# Patient Record
Sex: Male | Born: 1965 | Race: White | Hispanic: No | Marital: Married
Health system: Southern US, Community
[De-identification: ages and names within clinical notes are randomized; demographics above are authoritative.]

## PROBLEM LIST (undated history)

## (undated) DIAGNOSIS — G9341 Metabolic encephalopathy: Secondary | ICD-10-CM

## (undated) DIAGNOSIS — G40909 Epilepsy, unspecified, not intractable, without status epilepticus: Secondary | ICD-10-CM

## (undated) DIAGNOSIS — J9621 Acute and chronic respiratory failure with hypoxia: Secondary | ICD-10-CM

## (undated) DIAGNOSIS — F3181 Bipolar II disorder: Secondary | ICD-10-CM

## (undated) DIAGNOSIS — A419 Sepsis, unspecified organism: Secondary | ICD-10-CM

## (undated) DIAGNOSIS — Z79899 Other long term (current) drug therapy: Secondary | ICD-10-CM

## (undated) DIAGNOSIS — R6520 Severe sepsis without septic shock: Secondary | ICD-10-CM

---

## 2018-12-24 ENCOUNTER — Other Ambulatory Visit (HOSPITAL_COMMUNITY): Payer: Self-pay

## 2018-12-24 ENCOUNTER — Inpatient Hospital Stay
Admit: 2018-12-24 | Discharge: 2019-01-21 | Disposition: A | Discharge status: Home / Self Care | Payer: Medicare Other | Source: Other Acute Inpatient Hospital | Attending: Internal Medicine | Admitting: Internal Medicine

## 2018-12-24 DIAGNOSIS — Z9911 Dependence on respirator [ventilator] status: Secondary | ICD-10-CM

## 2018-12-24 DIAGNOSIS — G9341 Metabolic encephalopathy: Secondary | ICD-10-CM | POA: Diagnosis present

## 2018-12-24 DIAGNOSIS — J9621 Acute and chronic respiratory failure with hypoxia: Secondary | ICD-10-CM | POA: Diagnosis present

## 2018-12-24 DIAGNOSIS — Z4659 Encounter for fitting and adjustment of other gastrointestinal appliance and device: Secondary | ICD-10-CM

## 2018-12-24 DIAGNOSIS — J111 Influenza due to unidentified influenza virus with other respiratory manifestations: Secondary | ICD-10-CM

## 2018-12-24 DIAGNOSIS — F3181 Bipolar II disorder: Secondary | ICD-10-CM | POA: Diagnosis present

## 2018-12-24 DIAGNOSIS — Z79899 Other long term (current) drug therapy: Secondary | ICD-10-CM

## 2018-12-24 DIAGNOSIS — Z9289 Personal history of other medical treatment: Secondary | ICD-10-CM

## 2018-12-24 DIAGNOSIS — G40909 Epilepsy, unspecified, not intractable, without status epilepticus: Secondary | ICD-10-CM

## 2018-12-24 DIAGNOSIS — Z0189 Encounter for other specified special examinations: Secondary | ICD-10-CM

## 2018-12-24 DIAGNOSIS — J9690 Respiratory failure, unspecified, unspecified whether with hypoxia or hypercapnia: Secondary | ICD-10-CM

## 2018-12-24 DIAGNOSIS — J90 Pleural effusion, not elsewhere classified: Secondary | ICD-10-CM

## 2018-12-24 DIAGNOSIS — Z95828 Presence of other vascular implants and grafts: Secondary | ICD-10-CM

## 2018-12-24 DIAGNOSIS — A419 Sepsis, unspecified organism: Secondary | ICD-10-CM | POA: Diagnosis present

## 2018-12-24 DIAGNOSIS — R112 Nausea with vomiting, unspecified: Secondary | ICD-10-CM

## 2018-12-24 DIAGNOSIS — T85598A Other mechanical complication of other gastrointestinal prosthetic devices, implants and grafts, initial encounter: Secondary | ICD-10-CM

## 2018-12-24 DIAGNOSIS — J189 Pneumonia, unspecified organism: Secondary | ICD-10-CM

## 2018-12-24 DIAGNOSIS — R509 Fever, unspecified: Secondary | ICD-10-CM

## 2018-12-24 HISTORY — DX: Sepsis, unspecified organism: R65.20

## 2018-12-24 HISTORY — DX: Metabolic encephalopathy: G93.41

## 2018-12-24 HISTORY — DX: Bipolar II disorder: F31.81

## 2018-12-24 HISTORY — DX: Epilepsy, unspecified, not intractable, without status epilepticus: G40.909

## 2018-12-24 HISTORY — DX: Sepsis, unspecified organism: A41.9

## 2018-12-24 HISTORY — DX: Acute and chronic respiratory failure with hypoxia: J96.21

## 2018-12-24 HISTORY — DX: Other long term (current) drug therapy: Z79.899

## 2018-12-25 ENCOUNTER — Other Ambulatory Visit (HOSPITAL_COMMUNITY): Payer: Self-pay

## 2018-12-25 ENCOUNTER — Encounter: Payer: Self-pay | Admitting: Internal Medicine

## 2018-12-25 DIAGNOSIS — J9621 Acute and chronic respiratory failure with hypoxia: Secondary | ICD-10-CM | POA: Diagnosis present

## 2018-12-25 DIAGNOSIS — R6520 Severe sepsis without septic shock: Secondary | ICD-10-CM | POA: Diagnosis present

## 2018-12-25 DIAGNOSIS — G40909 Epilepsy, unspecified, not intractable, without status epilepticus: Secondary | ICD-10-CM

## 2018-12-25 DIAGNOSIS — F3181 Bipolar II disorder: Secondary | ICD-10-CM | POA: Diagnosis not present

## 2018-12-25 DIAGNOSIS — Z79899 Other long term (current) drug therapy: Secondary | ICD-10-CM

## 2018-12-25 DIAGNOSIS — A419 Sepsis, unspecified organism: Secondary | ICD-10-CM | POA: Diagnosis present

## 2018-12-25 DIAGNOSIS — G9341 Metabolic encephalopathy: Secondary | ICD-10-CM | POA: Diagnosis present

## 2018-12-25 LAB — COMPREHENSIVE METABOLIC PANEL
ALT: 15 U/L (ref 0–44)
AST: 19 U/L (ref 15–41)
Albumin: 2.9 g/dL — ABNORMAL LOW (ref 3.5–5.0)
Alkaline Phosphatase: 70 U/L (ref 38–126)
Anion gap: 9 (ref 5–15)
BUN: 7 mg/dL (ref 6–20)
CO2: 24 mmol/L (ref 22–32)
Calcium: 9.2 mg/dL (ref 8.9–10.3)
Chloride: 107 mmol/L (ref 98–111)
Creatinine, Ser: 0.71 mg/dL (ref 0.61–1.24)
GFR calc Af Amer: >60 mL/min (ref >60)
GFR calc non Af Amer: >60 mL/min (ref >60)
Glucose, Bld: 184 mg/dL — ABNORMAL HIGH (ref 70–99)
Potassium: 4.4 mmol/L (ref 3.5–5.1)
Sodium: 140 mmol/L (ref 135–145)
Total Bilirubin: 0.8 mg/dL (ref 0.3–1.2)
Total Protein: 8 g/dL (ref 6.5–8.1)

## 2018-12-25 LAB — CBC
HCT: 40.4 % (ref 39.0–52.0)
Hemoglobin: 13.3 g/dL (ref 13.0–17.0)
MCH: 29.6 pg (ref 26.0–34.0)
MCHC: 32.9 g/dL (ref 30.0–36.0)
MCV: 90 fL (ref 80.0–100.0)
Platelets: 366 10*3/uL (ref 150–400)
RBC: 4.49 MIL/uL (ref 4.22–5.81)
RDW: 13.3 % (ref 11.5–15.5)
WBC: 10.3 10*3/uL (ref 4.0–10.5)
nRBC: 0 % (ref 0.0–0.2)

## 2018-12-25 LAB — URINALYSIS, ROUTINE W REFLEX MICROSCOPIC
Bilirubin Urine: NEGATIVE
Glucose, UA: NEGATIVE mg/dL
Ketones, ur: 5 mg/dL — AB
Nitrite: NEGATIVE
Protein, ur: 30 mg/dL — AB
RBC / HPF: >50 RBC/hpf — ABNORMAL HIGH (ref 0–5)
Specific Gravity, Urine: 1.016 (ref 1.005–1.030)
pH: 5 (ref 5.0–8.0)

## 2018-12-25 LAB — BLOOD GAS, ARTERIAL
Acid-base deficit: 0.7 mmol/L (ref 0.0–2.0)
Bicarbonate: 23.7 mmol/L (ref 20.0–28.0)
FIO2: 40
MECHVT: 500 mL
O2 Saturation: 99.3 %
PEEP: 5 cmH2O
Patient temperature: 98.4
RATE: 12 resp/min
pCO2 arterial: 40.5 mmHg (ref 32.0–48.0)
pH, Arterial: 7.384 (ref 7.350–7.450)
pO2, Arterial: 177 mmHg — ABNORMAL HIGH (ref 83.0–108.0)

## 2018-12-25 LAB — APTT
aPTT: 20 seconds — ABNORMAL LOW (ref 24–36)
aPTT: 33 seconds (ref 24–36)

## 2018-12-25 LAB — PROTIME-INR
INR: 1.1 (ref 0.8–1.2)
INR: 1.1 (ref 0.8–1.2)
Prothrombin Time: 14.5 seconds (ref 11.4–15.2)
Prothrombin Time: 13.7 seconds (ref 11.4–15.2)

## 2018-12-25 LAB — MAGNESIUM: Magnesium: 1.8 mg/dL (ref 1.7–2.4)

## 2018-12-25 LAB — AMMONIA: Ammonia: 47 umol/L — ABNORMAL HIGH (ref 9–35)

## 2018-12-25 MED ORDER — DEXTROSE 10 % IV SOLN
250.00 | INTRAVENOUS | Status: DC
Start: ? — End: 2018-12-25

## 2018-12-25 MED ORDER — IPRATROPIUM-ALBUTEROL 0.5-2.5 (3) MG/3ML IN SOLN
3.00 | RESPIRATORY_TRACT | Status: DC
Start: ? — End: 2018-12-25

## 2018-12-25 MED ORDER — ONDANSETRON HCL 4 MG/2ML IJ SOLN
4.00 | INTRAMUSCULAR | Status: DC
Start: ? — End: 2018-12-25

## 2018-12-25 MED ORDER — LACTATED RINGERS IV SOLN
500.00 | INTRAVENOUS | Status: DC
Start: ? — End: 2018-12-25

## 2018-12-25 MED ORDER — HEPARIN SODIUM (PORCINE) 5000 UNIT/ML IJ SOLN
5000.00 | INTRAMUSCULAR | Status: DC
Start: 2018-12-24 — End: 2018-12-25

## 2018-12-25 MED ORDER — EPINEPHRINE 1 MG/10ML IJ SOSY
1.00 | PREFILLED_SYRINGE | INTRAMUSCULAR | Status: DC
Start: ? — End: 2018-12-25

## 2018-12-25 MED ORDER — ALPRAZOLAM 1 MG PO TABS
1.00 | ORAL_TABLET | ORAL | Status: DC
Start: 2018-12-25 — End: 2018-12-25

## 2018-12-25 MED ORDER — PROSOURCE TF PO LIQD
90.00 | ORAL | Status: DC
Start: ? — End: 2018-12-25

## 2018-12-25 MED ORDER — INSULIN ASPART 100 UNIT/ML FLEXPEN
0.00 | PEN_INJECTOR | SUBCUTANEOUS | Status: DC
Start: 2018-12-24 — End: 2018-12-25

## 2018-12-25 MED ORDER — INSULIN NPH (HUMAN) (ISOPHANE) 100 UNIT/ML ~~LOC~~ SUSP
15.00 | SUBCUTANEOUS | Status: DC
Start: 2018-12-24 — End: 2018-12-25

## 2018-12-25 MED ORDER — GENERIC EXTERNAL MEDICATION
4000.00 | Status: DC
Start: ? — End: 2018-12-25

## 2018-12-25 MED ORDER — ATORVASTATIN CALCIUM 20 MG PO TABS
20.00 | ORAL_TABLET | ORAL | Status: DC
Start: 2018-12-25 — End: 2018-12-25

## 2018-12-25 MED ORDER — FAMOTIDINE 20 MG PO TABS
20.00 | ORAL_TABLET | ORAL | Status: DC
Start: 2018-12-25 — End: 2018-12-25

## 2018-12-25 MED ORDER — LORAZEPAM 2 MG/ML IJ SOLN
2.00 | INTRAMUSCULAR | Status: DC
Start: ? — End: 2018-12-25

## 2018-12-25 MED ORDER — ATROPINE SULFATE 0.5 MG/5ML IJ SOSY
0.50 | PREFILLED_SYRINGE | INTRAMUSCULAR | Status: DC
Start: ? — End: 2018-12-25

## 2018-12-25 MED ORDER — CLINDAMYCIN HCL 150 MG PO CAPS
300.00 | ORAL_CAPSULE | ORAL | Status: DC
Start: 2018-12-24 — End: 2018-12-25

## 2018-12-25 MED ORDER — GENERIC EXTERNAL MEDICATION
Status: DC
Start: ? — End: 2018-12-25

## 2018-12-25 MED ORDER — GENERIC EXTERNAL MEDICATION
12.00 | Status: DC
Start: ? — End: 2018-12-25

## 2018-12-25 MED ORDER — ACETAMINOPHEN 325 MG PO TABS
650.00 | ORAL_TABLET | ORAL | Status: DC
Start: ? — End: 2018-12-25

## 2018-12-25 MED ORDER — LEVETIRACETAM 250 MG PO TABS
1000.00 | ORAL_TABLET | ORAL | Status: DC
Start: 2018-12-24 — End: 2018-12-25

## 2018-12-25 MED ORDER — GENERIC EXTERNAL MEDICATION
1.00 | Status: DC
Start: ? — End: 2018-12-25

## 2018-12-25 MED ORDER — ZOLPIDEM TARTRATE 5 MG PO TABS
5.00 | ORAL_TABLET | ORAL | Status: DC
Start: ? — End: 2018-12-25

## 2018-12-25 MED ORDER — FENTANYL CITRATE-NACL 1-0.9 MG/100ML-% IV SOLN
50.00 | INTRAVENOUS | Status: DC
Start: ? — End: 2018-12-25

## 2018-12-25 MED ORDER — VENLAFAXINE HCL 37.5 MG PO TABS
75.00 | ORAL_TABLET | ORAL | Status: DC
Start: 2018-12-25 — End: 2018-12-25

## 2018-12-25 MED ORDER — GENERIC EXTERNAL MEDICATION
5.00 | Status: DC
Start: ? — End: 2018-12-25

## 2018-12-25 MED ORDER — PROPOFOL 100 MG/10ML IV EMUL
20.00 | INTRAVENOUS | Status: DC
Start: ? — End: 2018-12-25

## 2018-12-25 MED ORDER — CHLORHEXIDINE GLUCONATE 0.12 % MT SOLN
15.00 | OROMUCOSAL | Status: DC
Start: 2018-12-24 — End: 2018-12-25

## 2018-12-25 MED ORDER — SODIUM CHLORIDE 0.9 % IV SOLN
INTRAVENOUS | Status: DC
Start: ? — End: 2018-12-25

## 2018-12-25 MED ORDER — GENERIC EXTERNAL MEDICATION
125.00 | Status: DC
Start: 2018-12-25 — End: 2018-12-25

## 2018-12-25 MED ORDER — GABAPENTIN 300 MG PO CAPS
300.00 | ORAL_CAPSULE | ORAL | Status: DC
Start: 2018-12-24 — End: 2018-12-25

## 2018-12-25 MED ORDER — DEXTROSE 10 % IV SOLN
INTRAVENOUS | Status: DC
Start: ? — End: 2018-12-25

## 2018-12-25 MED ORDER — NALOXONE HCL 0.4 MG/ML IJ SOLN
0.04 | INTRAMUSCULAR | Status: DC
Start: ? — End: 2018-12-25

## 2018-12-25 MED ORDER — VECURONIUM BROMIDE 10 MG IV SOLR
10.00 | INTRAVENOUS | Status: DC
Start: ? — End: 2018-12-25

## 2018-12-25 MED ORDER — VALPROIC ACID 250 MG/5ML PO SOLN
500.00 | ORAL | Status: DC
Start: 2018-12-24 — End: 2018-12-25

## 2018-12-25 MED ORDER — MAGNESIUM SULFATE 4 GM/100ML IV SOLN
4.00 | INTRAVENOUS | Status: DC
Start: ? — End: 2018-12-25

## 2018-12-25 MED ORDER — CARBOXYMETHYLCELLULOSE SOD PF 0.5 % OP SOLN
1.00 | OPHTHALMIC | Status: DC
Start: 2018-12-24 — End: 2018-12-25

## 2018-12-25 NOTE — Consult Note (Signed)
Pulmonary [place]  Date of Service: 12/25/2018  PULMONARY CRITICAL CARE TOR TSUDA Flemmings  ZOX:[id number]  DOB: 01-12-1966   DOA: 12/24/2018  Referring Physician: Merton Border, MD  HPI: Martin Hays is a 53 y.o. male seen for follow up of Acute on Chronic Respiratory Failure.  Patient has multiple medical problems was presented to the hospital with altered mental status patient has a history of bipolar disorder multiple medications being required for that.  The patient presented with respiratory failure and sepsis.  Psychiatry apparently did see the patient for medication issues.  When the patient initially presented in the emergency department he was noted to be extremely delirious combative patient ended up having to be intubated.  Patient also was found to have a red hot toe along with fevers.  It was felt because of this that the patient was septic.  Since admission to the ICU they have not been able to extubate the patient he was extubated on June 13 and had to be reintubated approximately 8 days later.  Right now he is orally intubated on the ventilator.  Hospital course patient was seen by podiatry for the issue with the toe and dressings were changed antibiotics were adjusted.  Patient was also seen by psychiatry and the polypharmacy was addressed at that time.  Patient right now is on full support on assist control mode and still has been requiring sedation with fentanyl and propofol.  Review of Systems:  ROS performed and is unremarkable other than noted above.  Past Medical History:  Diagnosis Date  . Anxiety  . Back pain  . Bipolar 1 disorder ([place])  . Bipolar disorder, unspecified ([place])  . Diabetes mellitus ([place])  . DM (diabetes mellitus) ([place])  . GERD (gastroesophageal reflux disease)  . H/O cardiovascular stress test  01/2014: No ischemia, EF 64%. Negative stress 03/2009  . Headache(784.0)  .  Hepatitis C  . History of echocardiogram 2015  EF 50-55%  . History of left heart catheterization  07/2007: Normal coronary angiography . RCA congentially diminutive small vessel.  Marland Kitchen History of syncope  . Hyperlipidemia  . Hypothyroidism  . Retinal perivasculitis  . Seizure disorder ([place])  . Status post placement of implantable loop recorder  . Syncope   Past Surgical History:  Procedure Laterality Date  . HX HEART CATHETERIZATION 09/2014  No critical disease RCA is nondominant  . HX HIP SURGERY  . HX SHOULDER SURGERY  . HX SPINAL SURGERY  . HX SURGICAL OTHER  implanted heart monitor  . HX TONSILLECTOMY  . LOOP RECORDER ANALYSIS IN PERSON   Family History  Problem Relation Name Age of Onset  . Heart Disease Father  . Heart Disease Mother  . Cancer-Breast Sister  . Cancer-Brain Maternal Grandfather   Social History   Socioeconomic History  . Marital status: Married  Spouse name: Not on file  . Number of children: Not on file  . Years of education: Not on file  . Highest education level: Not on file  Occupational History  . Not on file  Social Needs  . Financial resource strain: Not on file  . Food insecurity  Worry: Not on file  Inability: Not on file  . Transportation needs  Medical: Not on file  Non-medical: Not on file  Tobacco Use  . Smoking status: Never Smoker  . Smokeless tobacco: Current User  Types: Snuff  . Tobacco comment: started at age 18 dipping  Substance  and Sexual Activity  . Alcohol use: No   Family History: Non-Contributory to the present illness  Not on File  Medications: Reviewed on Rounds  Physical Exam:  Vitals: Temperature 97.0 pulse 96 respiratory rate was 28 blood pressure 149/83 saturations 94%  Ventilator Settings AC 28% Vt 533 Peep 5  . General: Comfortable at this time . Eyes: Grossly normal lids, irises & conjunctiva . ENT: grossly tongue is normal . Neck: no obvious mass . Cardiovascular: S1-S2 normal no gallop  or rub . Respiratory: No rhonchi no rales are noted . Abdomen: Soft and nontender . Skin: no rash seen on limited exam . Musculoskeletal: not rigid . Psychiatric:unable to assess . Neurologic: no seizure no involuntary movements         Labs on Admission:  Basic Metabolic Panel: Recent Labs  Lab 12/25/18 0930  NA 140  K 4.4  CL 107  CO2 24  GLUCOSE 184*  BUN 7  CREATININE 0.71  CALCIUM 9.2  MG 1.8    Recent Labs  Lab 12/25/18 0345  PHART 7.384  PCO2ART 40.5  PO2ART 177*  HCO3 23.7  O2SAT 99.3    Liver Function Tests: Recent Labs  Lab 12/25/18 0930  AST 19  ALT 15  ALKPHOS 70  BILITOT 0.8  PROT 8.0  ALBUMIN 2.9*   No results for input(s): LIPASE, AMYLASE in the last 168 hours. Recent Labs  Lab 12/25/18 0735  AMMONIA 47*    CBC: Recent Labs  Lab 12/25/18 0923  WBC 10.3  HGB 13.3  HCT 40.4  MCV 90.0  PLT 366    Cardiac Enzymes: No results for input(s): CKTOTAL, CKMB, CKMBINDEX, TROPONINI in the last 168 hours.  BNP (last 3 results) No results for input(s): BNP in the last 8760 hours.  ProBNP (last 3 results) No results for input(s): PROBNP in the last 8760 hours.   Radiological Exams on Admission: Dg Chest Port 1 View  Result Date: 12/25/2018 CLINICAL DATA:  Endotracheal tube placement. EXAM: PORTABLE CHEST 1 VIEW COMPARISON:  December 24, 2018 FINDINGS: The endotracheal tube terminates approximately 5.5 cm above the carina. The enteric tube extends below the left hemidiaphragm. The left-sided PICC line is well positioned. There is no pneumothorax. The left basilar airspace opacity is again noted. There is no large pleural effusion. There may be a small left-sided pleural effusion. IMPRESSION: 1. Lines and tubes as above. 2. Otherwise, stable appearance of the chest. Electronically Signed   By: Katherine Mantlehristopher  Green M.D.   On: 12/25/2018 01:48   Dg Chest Port 1 View  Result Date: 12/25/2018 CLINICAL DATA:  Endotracheal tube readjustment. EXAM:  PORTABLE CHEST 1 VIEW COMPARISON:  08/25/2018 FINDINGS: The endotracheal tube terminates approximately 6.4 cm above the carina. The enteric tube terminates below the left hemidiaphragm. The lungs are somewhat hyperexpanded. The patient's left-sided PICC line is stable in positioning. There is no pneumothorax. The heart size is stable. There is a possible left-sided pleural effusion. There may be atelectasis at the left lung base. IMPRESSION: Lines and tubes as above, otherwise stable appearance of the chest. Electronically Signed   By: Katherine Mantlehristopher  Green M.D.   On: 12/25/2018 00:18   Dg Chest Port 1 View  Result Date: 12/24/2018 CLINICAL DATA:  Endotracheal and nasogastric tube placement, respiratory failure EXAM: PORTABLE CHEST 1 VIEW COMPARISON:  None. FINDINGS: Endotracheal tube is positioned with tip over the mid trachea. Esophagogastric tube with tip and side port below the diaphragm, tip positioned in the vicinity of the pylorus  or duodenal bulb. Left upper extremity PICC, tip positioned over the mid SVC. No acute abnormality of the lungs. The heart and mediastinum are unremarkable. Nonobstructive pattern of bowel gas with gas present to the rectum. No free air in the abdomen. IMPRESSION: 1. Endotracheal tube is positioned with tip over the mid trachea. Esophagogastric tube with tip and side port below the diaphragm, tip positioned in the vicinity of the pylorus or duodenal bulb. Left upper extremity PICC, tip positioned over the mid SVC. 2. No acute abnormality of the lungs. The heart and mediastinum are unremarkable. 3. Nonobstructive pattern of bowel gas with gas present to the rectum. No free air in the abdomen. Electronically Signed   By: Lauralyn PrimesAlex  Bibbey M.D.   On: 12/24/2018 21:30   Dg Abd Portable 1v  Result Date: 12/24/2018 CLINICAL DATA:  Endotracheal and nasogastric tube placement, respiratory failure EXAM: PORTABLE CHEST 1 VIEW COMPARISON:  None. FINDINGS: Endotracheal tube is positioned with  tip over the mid trachea. Esophagogastric tube with tip and side port below the diaphragm, tip positioned in the vicinity of the pylorus or duodenal bulb. Left upper extremity PICC, tip positioned over the mid SVC. No acute abnormality of the lungs. The heart and mediastinum are unremarkable. Nonobstructive pattern of bowel gas with gas present to the rectum. No free air in the abdomen. IMPRESSION: 1. Endotracheal tube is positioned with tip over the mid trachea. Esophagogastric tube with tip and side port below the diaphragm, tip positioned in the vicinity of the pylorus or duodenal bulb. Left upper extremity PICC, tip positioned over the mid SVC. 2. No acute abnormality of the lungs. The heart and mediastinum are unremarkable. 3. Nonobstructive pattern of bowel gas with gas present to the rectum. No free air in the abdomen. Electronically Signed   By: Lauralyn PrimesAlex  Bibbey M.D.   On: 12/24/2018 21:30    Assessment/Plan Active Problems:   Acute on chronic respiratory failure with hypoxia (HCC)   Severe sepsis (HCC)   Acute metabolic encephalopathy   Seizure disorder (HCC)   Polypharmacy   Bipolar 2 disorder (HCC)   1. Acute on chronic respiratory failure with hypoxia patient has appears has primarily failed to wean because of delirium issues.  Even now the patient is orally intubated on assist control mode and has periods of increased agitation.  Medications are going to need to be adjusted in addition he has a history of chronic pain med use and this will need to be adjusted. 2. Severe sepsis probable sore diabetic ulcer of the right great toe patient has been treated with antibiotics now is afebrile we will continue to monitor follow-up cultures as necessary. 3. Seizure disorder he has no active seizures noted at this time we will continue to monitor. 4. Metabolic encephalopathy as a result of sepsis patient still quite confused we will continue with supportive care. 5. Polypharmacy is going to need  psychiatry and pain management evaluation. 6. Bipolar disorder as above will need psychiatry evaluation  I have personally seen and evaluated the patient, evaluated laboratory and imaging results, formulated the assessment and plan and placed orders. The Patient requires high complexity decision making for assessment and support.  Case was discussed on Rounds with the Respiratory Therapy Staff Time Spent 70minutes  Yevonne PaxSaadat A Tan Clopper, MD [facility] Pulmonary Critical Care Medicine Sleep Medicine

## 2018-12-26 ENCOUNTER — Other Ambulatory Visit (HOSPITAL_COMMUNITY): Payer: Self-pay

## 2018-12-26 DIAGNOSIS — J9621 Acute and chronic respiratory failure with hypoxia: Secondary | ICD-10-CM | POA: Diagnosis not present

## 2018-12-26 DIAGNOSIS — G9341 Metabolic encephalopathy: Secondary | ICD-10-CM | POA: Diagnosis not present

## 2018-12-26 DIAGNOSIS — Z79899 Other long term (current) drug therapy: Secondary | ICD-10-CM | POA: Diagnosis not present

## 2018-12-26 DIAGNOSIS — F3181 Bipolar II disorder: Secondary | ICD-10-CM | POA: Diagnosis not present

## 2018-12-26 LAB — BLOOD GAS, ARTERIAL
Acid-Base Excess: 3.1 mmol/L — ABNORMAL HIGH (ref 0.0–2.0)
Bicarbonate: 27.2 mmol/L (ref 20.0–28.0)
FIO2: 0.28
MECHVT: 500 mL
O2 Saturation: 96.2 %
PEEP: 5 cmH2O
Patient temperature: 98.6
RATE: 12 resp/min
pCO2 arterial: 42.5 mmHg (ref 32.0–48.0)
pH, Arterial: 7.422 (ref 7.350–7.450)
pO2, Arterial: 83.9 mmHg (ref 83.0–108.0)

## 2018-12-26 LAB — BASIC METABOLIC PANEL
Anion gap: 13 (ref 5–15)
BUN: 5 mg/dL — ABNORMAL LOW (ref 6–20)
CO2: 24 mmol/L (ref 22–32)
Calcium: 9.3 mg/dL (ref 8.9–10.3)
Chloride: 100 mmol/L (ref 98–111)
Creatinine, Ser: 0.69 mg/dL (ref 0.61–1.24)
GFR calc Af Amer: >60 mL/min (ref >60)
GFR calc non Af Amer: >60 mL/min (ref >60)
Glucose, Bld: 181 mg/dL — ABNORMAL HIGH (ref 70–99)
Potassium: 4.7 mmol/L (ref 3.5–5.1)
Sodium: 137 mmol/L (ref 135–145)

## 2018-12-26 LAB — HEMOGLOBIN A1C
Hgb A1c MFr Bld: 7.9 % — ABNORMAL HIGH (ref 4.8–5.6)
Mean Plasma Glucose: 180.03 mg/dL

## 2018-12-26 LAB — CBC
HCT: 41.9 % (ref 39.0–52.0)
Hemoglobin: 13.9 g/dL (ref 13.0–17.0)
MCH: 29.6 pg (ref 26.0–34.0)
MCHC: 33.2 g/dL (ref 30.0–36.0)
MCV: 89.1 fL (ref 80.0–100.0)
Platelets: 428 10*3/uL — ABNORMAL HIGH (ref 150–400)
RBC: 4.7 MIL/uL (ref 4.22–5.81)
RDW: 13.1 % (ref 11.5–15.5)
WBC: 13.8 10*3/uL — ABNORMAL HIGH (ref 4.0–10.5)
nRBC: 0 % (ref 0.0–0.2)

## 2018-12-26 LAB — PHOSPHORUS: Phosphorus: 3 mg/dL (ref 2.5–4.6)

## 2018-12-26 LAB — TSH: TSH: 15.474 u[IU]/mL — ABNORMAL HIGH (ref 0.350–4.500)

## 2018-12-26 LAB — T4, FREE: Free T4: 0.89 ng/dL (ref 0.61–1.12)

## 2018-12-26 LAB — URINE CULTURE: Culture: NO GROWTH

## 2018-12-26 LAB — MAGNESIUM: Magnesium: 1.7 mg/dL (ref 1.7–2.4)

## 2018-12-26 LAB — TRIGLYCERIDES: Triglycerides: 606 mg/dL — ABNORMAL HIGH (ref <150)

## 2018-12-26 MED ORDER — GENERIC EXTERNAL MEDICATION
Status: DC
Start: ? — End: 2018-12-26

## 2018-12-26 NOTE — Progress Notes (Signed)
Pulmonary Critical Care Medicine [place]   PULMONARY CRITICAL CARE SERVICE  PROGRESS NOTE  Date of Service: 12/26/2018  Martin Hays  [id number]  DOB: 1966/06/23   DOA: 12/24/2018  Referring Physician: Merton Border, MD  HPI: Martin Hays is a 53 y.o. male seen for follow up of Acute on Chronic Respiratory Failure.  Patient right now is on full vent support has been quite agitated medications are still being adjusted by the primary team  Medications: Reviewed on Rounds  Physical Exam:  Vitals: Temperature 99.7 pulse 105 respiratory 19 blood pressure 150/65 saturations 95%  Ventilator Settings mode ventilation assist control FiO2 28% tidal volume 500 PEEP 5  . General: Comfortable at this time . Eyes: Grossly normal lids, irises & conjunctiva . ENT: grossly tongue is normal . Neck: no obvious mass . Cardiovascular: S1 S2 normal no gallop . Respiratory: No rhonchi no rales are noted at this time . Abdomen: soft . Skin: no rash seen on limited exam . Musculoskeletal: not rigid . Psychiatric:unable to assess . Neurologic: no seizure no involuntary movements         Lab Data:   Basic Metabolic Panel: Recent Labs  Lab 12/25/18 0930  NA 140  K 4.4  CL 107  CO2 24  GLUCOSE 184*  BUN 7  CREATININE 0.71  CALCIUM 9.2  MG 1.8    ABG: Recent Labs  Lab 12/25/18 0345 12/26/18 0530  PHART 7.384 7.422  PCO2ART 40.5 42.5  PO2ART 177* 83.9  HCO3 23.7 27.2  O2SAT 99.3 96.2    Liver Function Tests: Recent Labs  Lab 12/25/18 0930  AST 19  ALT 15  ALKPHOS 70  BILITOT 0.8  PROT 8.0  ALBUMIN 2.9*   No results for input(s): LIPASE, AMYLASE in the last 168 hours. Recent Labs  Lab 12/25/18 0735  AMMONIA 47*    CBC: Recent Labs  Lab 12/25/18 0923  WBC 10.3  HGB 13.3  HCT 40.4  MCV 90.0  PLT 366    Cardiac Enzymes: No results for input(s): CKTOTAL, CKMB, CKMBINDEX, TROPONINI in the last 168 hours.  BNP (last 3  results) No results for input(s): BNP in the last 8760 hours.  ProBNP (last 3 results) No results for input(s): PROBNP in the last 8760 hours.  Radiological Exams: Dg Chest Port 1 View  Result Date: 12/25/2018 CLINICAL DATA:  Endotracheal tube placement. EXAM: PORTABLE CHEST 1 VIEW COMPARISON:  December 24, 2018 FINDINGS: The endotracheal tube terminates approximately 5.5 cm above the carina. The enteric tube extends below the left hemidiaphragm. The left-sided PICC line is well positioned. There is no pneumothorax. The left basilar airspace opacity is again noted. There is no large pleural effusion. There may be a small left-sided pleural effusion. IMPRESSION: 1. Lines and tubes as above. 2. Otherwise, stable appearance of the chest. Electronically Signed   By: Constance Holster M.D.   On: 12/25/2018 01:48   Dg Chest Port 1 View  Result Date: 12/25/2018 CLINICAL DATA:  Endotracheal tube readjustment. EXAM: PORTABLE CHEST 1 VIEW COMPARISON:  08/25/2018 FINDINGS: The endotracheal tube terminates approximately 6.4 cm above the carina. The enteric tube terminates below the left hemidiaphragm. The lungs are somewhat hyperexpanded. The patient's left-sided PICC line is stable in positioning. There is no pneumothorax. The heart size is stable. There is a possible left-sided pleural effusion. There may be atelectasis at the left lung base. IMPRESSION: Lines and tubes as above, otherwise stable appearance of the chest. Electronically Signed   By:  Katherine Mantlehristopher  Green M.D.   On: 12/25/2018 00:18   Dg Chest Port 1 View  Result Date: 12/24/2018 CLINICAL DATA:  Endotracheal and nasogastric tube placement, respiratory failure EXAM: PORTABLE CHEST 1 VIEW COMPARISON:  None. FINDINGS: Endotracheal tube is positioned with tip over the mid trachea. Esophagogastric tube with tip and side port below the diaphragm, tip positioned in the vicinity of the pylorus or duodenal bulb. Left upper extremity PICC, tip positioned over  the mid SVC. No acute abnormality of the lungs. The heart and mediastinum are unremarkable. Nonobstructive pattern of bowel gas with gas present to the rectum. No free air in the abdomen. IMPRESSION: 1. Endotracheal tube is positioned with tip over the mid trachea. Esophagogastric tube with tip and side port below the diaphragm, tip positioned in the vicinity of the pylorus or duodenal bulb. Left upper extremity PICC, tip positioned over the mid SVC. 2. No acute abnormality of the lungs. The heart and mediastinum are unremarkable. 3. Nonobstructive pattern of bowel gas with gas present to the rectum. No free air in the abdomen. Electronically Signed   By: Lauralyn PrimesAlex  Bibbey M.D.   On: 12/24/2018 21:30   Dg Abd Portable 1v  Result Date: 12/24/2018 CLINICAL DATA:  Endotracheal and nasogastric tube placement, respiratory failure EXAM: PORTABLE CHEST 1 VIEW COMPARISON:  None. FINDINGS: Endotracheal tube is positioned with tip over the mid trachea. Esophagogastric tube with tip and side port below the diaphragm, tip positioned in the vicinity of the pylorus or duodenal bulb. Left upper extremity PICC, tip positioned over the mid SVC. No acute abnormality of the lungs. The heart and mediastinum are unremarkable. Nonobstructive pattern of bowel gas with gas present to the rectum. No free air in the abdomen. IMPRESSION: 1. Endotracheal tube is positioned with tip over the mid trachea. Esophagogastric tube with tip and side port below the diaphragm, tip positioned in the vicinity of the pylorus or duodenal bulb. Left upper extremity PICC, tip positioned over the mid SVC. 2. No acute abnormality of the lungs. The heart and mediastinum are unremarkable. 3. Nonobstructive pattern of bowel gas with gas present to the rectum. No free air in the abdomen. Electronically Signed   By: Lauralyn PrimesAlex  Bibbey M.D.   On: 12/24/2018 21:30    Assessment/Plan Active Problems:   Acute on chronic respiratory failure with hypoxia (HCC)   Severe  sepsis (HCC)   Acute metabolic encephalopathy   Seizure disorder (HCC)   Polypharmacy   Bipolar 2 disorder (HCC)   1. Acute on chronic respiratory failure with hypoxia we will continue with full support on the ventilator and assist control mode.  Patient has good tidal volumes noted 2. Severe sepsis hemodynamically stable 3. Acute metabolic encephalopathy grossly unchanged 4. Seizure disorder no active seizures noted at this time. 5. Polypharmacy pharmacy consultation 6. Bipolar continue with supportive care   I have personally seen and evaluated the patient, evaluated laboratory and imaging results, formulated the assessment and plan and placed orders. The Patient requires high complexity decision making for assessment and support.  Case was discussed on Rounds with the Respiratory Therapy Staff  Yevonne PaxSaadat A Armanii Pressnell, MD [facility] Pulmonary Critical Care Medicine Sleep Medicine

## 2018-12-27 DIAGNOSIS — J9621 Acute and chronic respiratory failure with hypoxia: Secondary | ICD-10-CM | POA: Diagnosis not present

## 2018-12-27 DIAGNOSIS — Z79899 Other long term (current) drug therapy: Secondary | ICD-10-CM | POA: Diagnosis not present

## 2018-12-27 DIAGNOSIS — G9341 Metabolic encephalopathy: Secondary | ICD-10-CM | POA: Diagnosis not present

## 2018-12-27 DIAGNOSIS — F3181 Bipolar II disorder: Secondary | ICD-10-CM | POA: Diagnosis not present

## 2018-12-27 LAB — CBC
HCT: 32.6 % — ABNORMAL LOW (ref 39.0–52.0)
Hemoglobin: 10.9 g/dL — ABNORMAL LOW (ref 13.0–17.0)
MCH: 29.5 pg (ref 26.0–34.0)
MCHC: 33.4 g/dL (ref 30.0–36.0)
MCV: 88.1 fL (ref 80.0–100.0)
Platelets: 343 10*3/uL (ref 150–400)
RBC: 3.7 MIL/uL — ABNORMAL LOW (ref 4.22–5.81)
RDW: 13.1 % (ref 11.5–15.5)
WBC: 9.1 10*3/uL (ref 4.0–10.5)
nRBC: 0 % (ref 0.0–0.2)

## 2018-12-27 LAB — RENAL FUNCTION PANEL
Albumin: 2.5 g/dL — ABNORMAL LOW (ref 3.5–5.0)
Anion gap: 12 (ref 5–15)
BUN: 11 mg/dL (ref 6–20)
CO2: 23 mmol/L (ref 22–32)
Calcium: 8.7 mg/dL — ABNORMAL LOW (ref 8.9–10.3)
Chloride: 99 mmol/L (ref 98–111)
Creatinine, Ser: 0.76 mg/dL (ref 0.61–1.24)
GFR calc Af Amer: >60 mL/min (ref >60)
GFR calc non Af Amer: >60 mL/min (ref >60)
Glucose, Bld: 206 mg/dL — ABNORMAL HIGH (ref 70–99)
Phosphorus: 3.1 mg/dL (ref 2.5–4.6)
Potassium: 4 mmol/L (ref 3.5–5.1)
Sodium: 134 mmol/L — ABNORMAL LOW (ref 135–145)

## 2018-12-27 LAB — C DIFFICILE QUICK SCREEN W PCR REFLEX
C Diff antigen: NEGATIVE
C Diff interpretation: NOT DETECTED
C Diff toxin: NEGATIVE

## 2018-12-27 LAB — MAGNESIUM: Magnesium: 2 mg/dL (ref 1.7–2.4)

## 2018-12-27 NOTE — Progress Notes (Signed)
Pulmonary Critical Care Medicine [place]   PULMONARY CRITICAL CARE SERVICE  PROGRESS NOTE  Date of Service: 12/27/2018  Martin Hays  YQM:[id number]  DOB: 11-Oct-1965   DOA: 12/24/2018  Referring Physician: Merton Border, MD  HPI: Martin Hays is a 53 y.o. male seen for follow up of Acute on Chronic Respiratory Failure.  Patient currently is on pressure support mode has been on 28% FiO2  Medications: Reviewed on Rounds  Physical Exam:  Vitals: Temperature 97.8 pulse 75 respiratory 19 blood pressure 110/52 saturations 97%  Ventilator Settings mode ventilation pressure support FiO2 20% tidal volume 365 pressure poor 12 PEEP 5  . General: Comfortable at this time . Eyes: Grossly normal lids, irises & conjunctiva . ENT: grossly tongue is normal . Neck: no obvious mass . Cardiovascular: S1 S2 normal no gallop . Respiratory: No rhonchi no rales are noted . Abdomen: soft . Skin: no rash seen on limited exam . Musculoskeletal: not rigid . Psychiatric:unable to assess . Neurologic: no seizure no involuntary movements         Lab Data:   Basic Metabolic Panel: Recent Labs  Lab 12/25/18 0930 12/26/18 0928 12/27/18 0752  NA 140 137 134*  K 4.4 4.7 4.0  CL 107 100 99  CO2 24 24 23   GLUCOSE 184* 181* 206*  BUN 7 5* 11  CREATININE 0.71 0.69 0.76  CALCIUM 9.2 9.3 8.7*  MG 1.8 1.7 2.0  PHOS  --  3.0 3.1    ABG: Recent Labs  Lab 12/25/18 0345 12/26/18 0530  PHART 7.384 7.422  PCO2ART 40.5 42.5  PO2ART 177* 83.9  HCO3 23.7 27.2  O2SAT 99.3 96.2    Liver Function Tests: Recent Labs  Lab 12/25/18 0930 12/27/18 0752  AST 19  --   ALT 15  --   ALKPHOS 70  --   BILITOT 0.8  --   PROT 8.0  --   ALBUMIN 2.9* 2.5*   No results for input(s): LIPASE, AMYLASE in the last 168 hours. Recent Labs  Lab 12/25/18 0735  AMMONIA 47*    CBC: Recent Labs  Lab 12/25/18 0923 12/26/18 0928 12/27/18 0752  WBC 10.3 13.8* 9.1  HGB 13.3 13.9  10.9*  HCT 40.4 41.9 32.6*  MCV 90.0 89.1 88.1  PLT 366 428* 343    Cardiac Enzymes: No results for input(s): CKTOTAL, CKMB, CKMBINDEX, TROPONINI in the last 168 hours.  BNP (last 3 results) No results for input(s): BNP in the last 8760 hours.  ProBNP (last 3 results) No results for input(s): PROBNP in the last 8760 hours.  Radiological Exams: Dg Chest Port 1 View  Result Date: 12/26/2018 CLINICAL DATA:  PICC line placement EXAM: PORTABLE CHEST 1 VIEW COMPARISON:  12/26/2018, 12/25/2018 FINDINGS: Endotracheal tube tip is about 4.1 cm superior to the carina. Esophageal tube tip is below the diaphragm but non included. Left upper extremity catheter tip overlies the proximal SVC. Similar appearance of small left effusion and basilar airspace disease. IMPRESSION: 1. Left upper extremity catheter tip superimposes the upper SVC. 2. No change in small left effusion and left basilar atelectasis or pneumonia Electronically Signed   By: Donavan Foil M.D.   On: 12/26/2018 16:21   Dg Chest Port 1 View  Result Date: 12/26/2018 CLINICAL DATA:  Ventilator dependent. EXAM: PORTABLE CHEST 1 VIEW COMPARISON:  Chest x-ray from yesterday. FINDINGS: Unchanged endotracheal and enteric tubes. Unchanged left upper extremity PICC line. The heart size and mediastinal contours are within normal limits. Normal  pulmonary vascularity. Unchanged left basilar opacity and possible small left pleural effusion. The right lung is clear. No pneumothorax. No acute osseous abnormality. IMPRESSION: 1. Stable left basilar airspace disease and possible small left pleural effusion. Electronically Signed   By: Obie DredgeWilliam T Derry M.D.   On: 12/26/2018 14:22    Assessment/Plan Active Problems:   Acute on chronic respiratory failure with hypoxia (HCC)   Severe sepsis (HCC)   Acute metabolic encephalopathy   Seizure disorder (HCC)   Polypharmacy   Bipolar 2 disorder (HCC)   1. Acute on chronic respiratory failure with hypoxia  patient is on the wean protocol currently on 12.5 on pressure support tolerating it well.  Still is requiring sedation however we will continue to monitor closely 2. Severe sepsis patient is hemodynamically stable 3. Metabolic encephalopathy still has confusion when sedation has backed off 4. Seizure disorder no active seizures noted 5. Polypharmacy followed by our pharmacist 6. Bipolar disorder supportive care   I have personally seen and evaluated the patient, evaluated laboratory and imaging results, formulated the assessment and plan and placed orders. The Patient requires high complexity decision making for assessment and support.  Case was discussed on Rounds with the Respiratory Therapy Staff  Martin PaxSaadat A Korver Graybeal, MD [facility] Pulmonary Critical Care Medicine Sleep Medicine

## 2018-12-28 DIAGNOSIS — J9621 Acute and chronic respiratory failure with hypoxia: Secondary | ICD-10-CM | POA: Diagnosis not present

## 2018-12-28 DIAGNOSIS — Z79899 Other long term (current) drug therapy: Secondary | ICD-10-CM | POA: Diagnosis not present

## 2018-12-28 DIAGNOSIS — G9341 Metabolic encephalopathy: Secondary | ICD-10-CM | POA: Diagnosis not present

## 2018-12-28 DIAGNOSIS — F3181 Bipolar II disorder: Secondary | ICD-10-CM | POA: Diagnosis not present

## 2018-12-28 LAB — MAGNESIUM: Magnesium: 1.9 mg/dL (ref 1.7–2.4)

## 2018-12-28 LAB — CBC
HCT: 36.8 % — ABNORMAL LOW (ref 39.0–52.0)
Hemoglobin: 12 g/dL — ABNORMAL LOW (ref 13.0–17.0)
MCH: 29.1 pg (ref 26.0–34.0)
MCHC: 32.6 g/dL (ref 30.0–36.0)
MCV: 89.1 fL (ref 80.0–100.0)
Platelets: 422 10*3/uL — ABNORMAL HIGH (ref 150–400)
RBC: 4.13 MIL/uL — ABNORMAL LOW (ref 4.22–5.81)
RDW: 13.2 % (ref 11.5–15.5)
WBC: 9.8 10*3/uL (ref 4.0–10.5)
nRBC: 0 % (ref 0.0–0.2)

## 2018-12-28 LAB — BASIC METABOLIC PANEL
Anion gap: 9 (ref 5–15)
BUN: 9 mg/dL (ref 6–20)
CO2: 25 mmol/L (ref 22–32)
Calcium: 8.9 mg/dL (ref 8.9–10.3)
Chloride: 101 mmol/L (ref 98–111)
Creatinine, Ser: 0.61 mg/dL (ref 0.61–1.24)
GFR calc Af Amer: >60 mL/min (ref >60)
GFR calc non Af Amer: >60 mL/min (ref >60)
Glucose, Bld: 277 mg/dL — ABNORMAL HIGH (ref 70–99)
Potassium: 4.5 mmol/L (ref 3.5–5.1)
Sodium: 135 mmol/L (ref 135–145)

## 2018-12-28 LAB — TRIGLYCERIDES: Triglycerides: 632 mg/dL — ABNORMAL HIGH (ref <150)

## 2018-12-28 LAB — CULTURE, RESPIRATORY W GRAM STAIN: Culture: NO GROWTH

## 2018-12-28 LAB — PHOSPHORUS: Phosphorus: 3.1 mg/dL (ref 2.5–4.6)

## 2018-12-28 NOTE — Progress Notes (Addendum)
Pulmonary Critical Care Medicine [place]   PULMONARY CRITICAL CARE SERVICE  PROGRESS NOTE  Date of Service: 12/28/2018  Blair Flemmings  [id number]  DOB: 08/14/1965   DOA: 12/24/2018  Referring Physician: Merton Border, MD  HPI: Martin Hays is a 53 y.o. male seen for follow up of Acute on Chronic Respiratory Failure.  Patient was able to tolerate 4 hours on pressure support today and is now resting on the ventilator with a rate of 12 FiO2 28%.  No distress noted at this time.  Medications: Reviewed on Rounds  Physical Exam:  Vitals: Pulse 3 respirations 17 BP 149/88 O2 sat 100% temp 7.7  Ventilator Settings ventilator mode AC VC rate of 12 tidal volume 500 PEEP of 5 FiO2 28%  . General: Comfortable at this time . Eyes: Grossly normal lids, irises & conjunctiva . ENT: grossly tongue is normal . Neck: no obvious mass . Cardiovascular: S1 S2 normal no gallop . Respiratory: No rales or rhonchi noted . Abdomen: soft . Skin: no rash seen on limited exam . Musculoskeletal: not rigid . Psychiatric:unable to assess . Neurologic: no seizure no involuntary movements         Lab Data:   Basic Metabolic Panel: Recent Labs  Lab 12/25/18 0930 12/26/18 0928 12/27/18 0752 12/28/18 0845  NA 140 137 134* 135  K 4.4 4.7 4.0 4.5  CL 107 100 99 101  CO2 24 24 23 25   GLUCOSE 184* 181* 206* 277*  BUN 7 5* 11 9  CREATININE 0.71 0.69 0.76 0.61  CALCIUM 9.2 9.3 8.7* 8.9  MG 1.8 1.7 2.0 1.9  PHOS  --  3.0 3.1 3.1    ABG: Recent Labs  Lab 12/25/18 0345 12/26/18 0530  PHART 7.384 7.422  PCO2ART 40.5 42.5  PO2ART 177* 83.9  HCO3 23.7 27.2  O2SAT 99.3 96.2    Liver Function Tests: Recent Labs  Lab 12/25/18 0930 12/27/18 0752  AST 19  --   ALT 15  --   ALKPHOS 70  --   BILITOT 0.8  --   PROT 8.0  --   ALBUMIN 2.9* 2.5*   No results for input(s): LIPASE, AMYLASE in the last 168 hours. Recent Labs  Lab 12/25/18 0735  AMMONIA 47*     CBC: Recent Labs  Lab 12/25/18 0923 12/26/18 0928 12/27/18 0752 12/28/18 0845  WBC 10.3 13.8* 9.1 9.8  HGB 13.3 13.9 10.9* 12.0*  HCT 40.4 41.9 32.6* 36.8*  MCV 90.0 89.1 88.1 89.1  PLT 366 428* 343 422*    Cardiac Enzymes: No results for input(s): CKTOTAL, CKMB, CKMBINDEX, TROPONINI in the last 168 hours.  BNP (last 3 results) No results for input(s): BNP in the last 8760 hours.  ProBNP (last 3 results) No results for input(s): PROBNP in the last 8760 hours.  Radiological Exams: No results found.  Assessment/Plan Active Problems:   Acute on chronic respiratory failure with hypoxia (HCC)   Severe sepsis (HCC)   Acute metabolic encephalopathy   Seizure disorder (HCC)   Polypharmacy   Bipolar 2 disorder ([place])   1. Acute on chronic respiratory with hypoxia patient weaned today for 4 hours on pressure support 12/5 with an FiO2 of 28%.  Now is resting back on ventilator with no distress.  Continue aggressive pulmonary toilet supportive measures. 2. Severe sepsis patient is hemodynamically stable 3. Medical encephalopathy some confusion is still noted continue to monitor 4. Seizure disorder no active seizures noted 5. Polypharmacy followed by pharmacy 6. Bipolar  disorder continue supportive care   I have personally seen and evaluated the patient, evaluated laboratory and imaging results, formulated the assessment and plan and placed orders. The Patient requires high complexity decision making for assessment and support.  Case was discussed on Rounds with the Respiratory Therapy Staff  Yevonne PaxSaadat A Khan, MD [facility] Pulmonary Critical Care Medicine Sleep Medicine

## 2018-12-29 ENCOUNTER — Other Ambulatory Visit (HOSPITAL_COMMUNITY): Payer: Self-pay

## 2018-12-29 DIAGNOSIS — J9621 Acute and chronic respiratory failure with hypoxia: Secondary | ICD-10-CM | POA: Diagnosis not present

## 2018-12-29 DIAGNOSIS — G9341 Metabolic encephalopathy: Secondary | ICD-10-CM | POA: Diagnosis not present

## 2018-12-29 DIAGNOSIS — Z79899 Other long term (current) drug therapy: Secondary | ICD-10-CM | POA: Diagnosis not present

## 2018-12-29 DIAGNOSIS — F3181 Bipolar II disorder: Secondary | ICD-10-CM | POA: Diagnosis not present

## 2018-12-29 LAB — TRIGLYCERIDES: Triglycerides: 357 mg/dL — ABNORMAL HIGH (ref <150)

## 2018-12-29 MED ORDER — GENERIC EXTERNAL MEDICATION
Status: DC
Start: ? — End: 2018-12-29

## 2018-12-29 NOTE — Progress Notes (Addendum)
[facility]   PULMONARY CRITICAL CARE SERVICE  PROGRESS NOTE  Date of Service: 12/29/2018  Martin BerkshireJohnny Hays  ZOX:[id number]  DOB: May 21, 1966   DOA: 12/24/2018  Referring Physician: Carron CurieAli Hijazi, MD  HPI: Martin Hays is a 53 y.o. male seen for follow up of Acute on Chronic Respiratory Failure.  Patient continues on pressure support 12/5 FiO2 28% for goal of 8 hours today.  Satting well currently with no distress.  Medications: Reviewed on Rounds  Physical Exam:  Vitals: Pulse 79 respirations 18 BP 119/72 O2 sat 98% temp 98.7  Ventilator Settings pressure support 12/5 FiO2 28%  . General: Comfortable at this time . Eyes: Grossly normal lids, irises & conjunctiva . ENT: grossly tongue is normal . Neck: no obvious mass . Cardiovascular: S1 S2 normal no gallop . Respiratory: No rales or rhonchi noted . Abdomen: soft . Skin: no rash seen on limited exam . Musculoskeletal: not rigid . Psychiatric:unable to assess . Neurologic: no seizure no involuntary movements         Lab Data:   Basic Metabolic Panel: Recent Labs  Lab 12/25/18 0930 12/26/18 0928 12/27/18 0752 12/28/18 0845  NA 140 137 134* 135  K 4.4 4.7 4.0 4.5  CL 107 100 99 101  CO2 24 24 23 25   GLUCOSE 184* 181* 206* 277*  BUN 7 5* 11 9  CREATININE 0.71 0.69 0.76 0.61  CALCIUM 9.2 9.3 8.7* 8.9  MG 1.8 1.7 2.0 1.9  PHOS  --  3.0 3.1 3.1    ABG: Recent Labs  Lab 12/25/18 0345 12/26/18 0530  PHART 7.384 7.422  PCO2ART 40.5 42.5  PO2ART 177* 83.9  HCO3 23.7 27.2  O2SAT 99.3 96.2    Liver Function Tests: Recent Labs  Lab 12/25/18 0930 12/27/18 0752  AST 19  --   ALT 15  --   ALKPHOS 70  --   BILITOT 0.8  --   PROT 8.0  --   ALBUMIN 2.9* 2.5*   No results for input(s): LIPASE, AMYLASE in the last 168 hours. Recent Labs  Lab 12/25/18 0735  AMMONIA 47*    CBC: Recent Labs  Lab 12/25/18 0923 12/26/18 0928 12/27/18 0752  12/28/18 0845  WBC 10.3 13.8* 9.1 9.8  HGB 13.3 13.9 10.9* 12.0*  HCT 40.4 41.9 32.6* 36.8*  MCV 90.0 89.1 88.1 89.1  PLT 366 428* 343 422*    Cardiac Enzymes: No results for input(s): CKTOTAL, CKMB, CKMBINDEX, TROPONINI in the last 168 hours.  BNP (last 3 results) No results for input(s): BNP in the last 8760 hours.  ProBNP (last 3 results) No results for input(s): PROBNP in the last 8760 hours.  Radiological Exams: Dg Abd Portable 1v  Result Date: 12/29/2018 CLINICAL DATA:  Nasogastric tube placement EXAM: PORTABLE ABDOMEN - 1 VIEW COMPARISON:  Portable exam 1554 hours compared to 12/24/2018 FINDINGS: Tip of nasogastric tube projects over distal gastric antrum. No bowel distention. Bones demineralized. Prior lumbar fusion. IMPRESSION: Tip of nasogastric tube projects over distal gastric antrum. Electronically Signed   By: Ulyses SouthwardMark  Boles M.D.   On: 12/29/2018 16:29    Assessment/Plan Active Problems:   Acute on chronic respiratory failure with hypoxia (HCC)   Severe sepsis (HCC)   Acute metabolic encephalopathy   Seizure disorder (HCC)   Polypharmacy   Bipolar 2 disorder (HCC)   1. Acute on chronic respiratory with hypoxia patient weaned today for 8  hours on pressure support 12/5 with an FiO2 of  28%.  Now is resting back on ventilator with no distress.  Continue aggressive pulmonary toilet supportive measures. 2. Severe sepsis patient is hemodynamically stable 3. Medical encephalopathy some confusion is still noted continue to monitor 4. Seizure disorder no active seizures noted 5. Polypharmacy followed by pharmacy 6. Bipolar disorder continue supportive care   I have personally seen and evaluated the patient, evaluated laboratory and imaging results, formulated the assessment and plan and placed orders. The Patient requires high complexity decision making for assessment and support.  Case was discussed on Rounds with the Respiratory Therapy Staff  [name], MD  [facility] Pulmonary Critical Care Medicine Sleep Medicine

## 2018-12-30 DIAGNOSIS — G9341 Metabolic encephalopathy: Secondary | ICD-10-CM | POA: Diagnosis not present

## 2018-12-30 DIAGNOSIS — F3181 Bipolar II disorder: Secondary | ICD-10-CM | POA: Diagnosis not present

## 2018-12-30 DIAGNOSIS — Z79899 Other long term (current) drug therapy: Secondary | ICD-10-CM | POA: Diagnosis not present

## 2018-12-30 DIAGNOSIS — J9621 Acute and chronic respiratory failure with hypoxia: Secondary | ICD-10-CM | POA: Diagnosis not present

## 2018-12-30 LAB — CBC
HCT: 34.1 % — ABNORMAL LOW (ref 39.0–52.0)
Hemoglobin: 11.3 g/dL — ABNORMAL LOW (ref 13.0–17.0)
MCH: 29.3 pg (ref 26.0–34.0)
MCHC: 33.1 g/dL (ref 30.0–36.0)
MCV: 88.3 fL (ref 80.0–100.0)
Platelets: 438 10*3/uL — ABNORMAL HIGH (ref 150–400)
RBC: 3.86 MIL/uL — ABNORMAL LOW (ref 4.22–5.81)
RDW: 12.8 % (ref 11.5–15.5)
WBC: 10.2 10*3/uL (ref 4.0–10.5)
nRBC: 0 % (ref 0.0–0.2)

## 2018-12-30 LAB — MAGNESIUM: Magnesium: 1.8 mg/dL (ref 1.7–2.4)

## 2018-12-30 LAB — BASIC METABOLIC PANEL
Anion gap: 10 (ref 5–15)
BUN: 11 mg/dL (ref 6–20)
CO2: 28 mmol/L (ref 22–32)
Calcium: 9.3 mg/dL (ref 8.9–10.3)
Chloride: 98 mmol/L (ref 98–111)
Creatinine, Ser: 0.65 mg/dL (ref 0.61–1.24)
GFR calc Af Amer: >60 mL/min (ref >60)
GFR calc non Af Amer: >60 mL/min (ref >60)
Glucose, Bld: 287 mg/dL — ABNORMAL HIGH (ref 70–99)
Potassium: 4.7 mmol/L (ref 3.5–5.1)
Sodium: 136 mmol/L (ref 135–145)

## 2018-12-30 LAB — AMMONIA: Ammonia: 77 umol/L — ABNORMAL HIGH (ref 9–35)

## 2018-12-30 NOTE — Progress Notes (Signed)
Pulmonary Critical Care Medicine [place]   PULMONARY CRITICAL CARE SERVICE  PROGRESS NOTE  Date of Service: 12/30/2018  Martin Hays  QIW:[id number]  DOB: [birth year]   DOA: 12/24/2018  Referring Physician: Merton Border, MD  HPI: Martin Hays is a 53 y.o. male seen for follow up of Acute on Chronic Respiratory Failure.  This morning patient is supposed to wean on pressure support for a goal of 12 hours  Medications: Reviewed on Rounds  Physical Exam:  Vitals: Temperature 99.2 pulse 98 respiratory rate 16 blood pressure 112/68 saturations 97%  Ventilator Settings mode of ventilation assist control FiO2 28% tidal volume 529 PEEP 5  . General: Comfortable at this time . Eyes: Grossly normal lids, irises & conjunctiva . ENT: grossly tongue is normal . Neck: no obvious mass . Cardiovascular: S1 S2 normal no gallop . Respiratory: No rhonchi no rales . Abdomen: soft . Skin: no rash seen on limited exam . Musculoskeletal: not rigid . Psychiatric:unable to assess . Neurologic: no seizure no involuntary movements         Lab Data:   Basic Metabolic Panel: Recent Labs  Lab 12/25/18 0930 12/26/18 0928 12/27/18 0752 12/28/18 0845 12/30/18 1125  NA 140 137 134* 135 136  K 4.4 4.7 4.0 4.5 4.7  CL 107 100 99 101 98  CO2 24 24 23 25 28   GLUCOSE 184* 181* 206* 277* 287*  BUN 7 5* 11 9 11   CREATININE 0.71 0.69 0.76 0.61 0.65  CALCIUM 9.2 9.3 8.7* 8.9 9.3  MG 1.8 1.7 2.0 1.9 1.8  PHOS  --  3.0 3.1 3.1  --     ABG: Recent Labs  Lab 12/25/18 0345 12/26/18 0530  PHART 7.384 7.422  PCO2ART 40.5 42.5  PO2ART 177* 83.9  HCO3 23.7 27.2  O2SAT 99.3 96.2    Liver Function Tests: Recent Labs  Lab 12/25/18 0930 12/27/18 0752  AST 19  --   ALT 15  --   ALKPHOS 70  --   BILITOT 0.8  --   PROT 8.0  --   ALBUMIN 2.9* 2.5*   No results for input(s): LIPASE, AMYLASE in the last 168 hours. Recent Labs  Lab 12/25/18 0735 12/30/18 0809   AMMONIA 47* 77*    CBC: Recent Labs  Lab 12/25/18 0923 12/26/18 0928 12/27/18 0752 12/28/18 0845 12/30/18 1125  WBC 10.3 13.8* 9.1 9.8 10.2  HGB 13.3 13.9 10.9* 12.0* 11.3*  HCT 40.4 41.9 32.6* 36.8* 34.1*  MCV 90.0 89.1 88.1 89.1 88.3  PLT 366 428* 343 422* 438*    Cardiac Enzymes: No results for input(s): CKTOTAL, CKMB, CKMBINDEX, TROPONINI in the last 168 hours.  BNP (last 3 results) No results for input(s): BNP in the last 8760 hours.  ProBNP (last 3 results) No results for input(s): PROBNP in the last 8760 hours.  Radiological Exams: Dg Abd Portable 1v  Result Date: 12/29/2018 CLINICAL DATA:  Nasogastric tube placement EXAM: PORTABLE ABDOMEN - 1 VIEW COMPARISON:  Portable exam 1554 hours compared to 12/24/2018 FINDINGS: Tip of nasogastric tube projects over distal gastric antrum. No bowel distention. Bones demineralized. Prior lumbar fusion. IMPRESSION: Tip of nasogastric tube projects over distal gastric antrum. Electronically Signed   By: Lavonia Dana M.D.   On: 12/29/2018 16:29    Assessment/Plan Active Problems:   Acute on chronic respiratory failure with hypoxia (HCC)   Severe sepsis (HCC)   Acute metabolic encephalopathy   Seizure disorder (HCC)   Polypharmacy   Bipolar 2  disorder (HCC)   1. Acute on chronic respiratory failure with hypoxia we will continue with full support on the ventilator and wean pressure support as per weaning protocol. 2. Severe sepsis hemodynamically stable resolved we will continue to follow along 3. Metabolic encephalopathy grossly unchanged we will continue with present management. 4. Seizure disorder no active seizure noted 5. Polypharmacy followed by our pharmacist 6. Bipolar disorder present management   I have personally seen and evaluated the patient, evaluated laboratory and imaging results, formulated the assessment and plan and placed orders. The Patient requires high complexity decision making for assessment and  support.  Case was discussed on Rounds with the Respiratory Therapy Staff  Yevonne PaxSaadat A , MD [facility] Pulmonary Critical Care Medicine Sleep Medicine

## 2018-12-31 ENCOUNTER — Other Ambulatory Visit (HOSPITAL_COMMUNITY): Payer: Self-pay

## 2018-12-31 DIAGNOSIS — F3181 Bipolar II disorder: Secondary | ICD-10-CM | POA: Diagnosis not present

## 2018-12-31 DIAGNOSIS — Z79899 Other long term (current) drug therapy: Secondary | ICD-10-CM | POA: Diagnosis not present

## 2018-12-31 DIAGNOSIS — G9341 Metabolic encephalopathy: Secondary | ICD-10-CM | POA: Diagnosis not present

## 2018-12-31 DIAGNOSIS — J9621 Acute and chronic respiratory failure with hypoxia: Secondary | ICD-10-CM | POA: Diagnosis not present

## 2018-12-31 LAB — URINALYSIS, ROUTINE W REFLEX MICROSCOPIC
Bacteria, UA: NONE SEEN
Bilirubin Urine: NEGATIVE
Glucose, UA: 50 mg/dL — AB
Hgb urine dipstick: NEGATIVE
Ketones, ur: 5 mg/dL — AB
Nitrite: NEGATIVE
Protein, ur: 30 mg/dL — AB
Specific Gravity, Urine: 1.02 (ref 1.005–1.030)
pH: 6 (ref 5.0–8.0)

## 2018-12-31 MED ORDER — GENERIC EXTERNAL MEDICATION
Status: DC
Start: ? — End: 2018-12-31

## 2018-12-31 NOTE — Progress Notes (Addendum)
[facility]   PULMONARY CRITICAL CARE SERVICE  PROGRESS NOTE  Date of Service: 12/31/2018  Martin BerkshireJohnny Hays  MVH:[id number]  DOB: [birth year]   DOA: 12/24/2018  Referring Physician: Carron CurieAli Hijazi, MD  HPI: Martin DaftJohnny Hays is a 53 y.o. male seen for follow up of Acute on Chronic Respiratory Failure.  Patient is a 16-hour goal today_12/5 FiO2 28% currently satting well with no distress.  Medications: Reviewed on Rounds  Physical Exam:  Vitals: Pulse 43 respirations 18 BP 120/63 O2 sat 95% to 94.7  Ventilator Settings pressure for 12/5 FiO2 28%  . General: Comfortable at this time . Eyes: Grossly normal lids, irises & conjunctiva . ENT: grossly tongue is normal . Neck: no obvious mass . Cardiovascular: S1 S2 normal no gallop . Respiratory: No rales or rhonchi noted . Abdomen: soft . Skin: no rash seen on limited exam . Musculoskeletal: not rigid . Psychiatric:unable to assess . Neurologic: no seizure no involuntary movements         Lab Data:   Basic Metabolic Panel: Recent Labs  Lab 12/25/18 0930 12/26/18 0928 12/27/18 0752 12/28/18 0845 12/30/18 1125  NA 140 137 134* 135 136  K 4.4 4.7 4.0 4.5 4.7  CL 107 100 99 101 98  CO2 24 24 23 25 28   GLUCOSE 184* 181* 206* 277* 287*  BUN 7 5* 11 9 11   CREATININE 0.71 0.69 0.76 0.61 0.65  CALCIUM 9.2 9.3 8.7* 8.9 9.3  MG 1.8 1.7 2.0 1.9 1.8  PHOS  --  3.0 3.1 3.1  --     ABG: Recent Labs  Lab 12/25/18 0345 12/26/18 0530  PHART 7.384 7.422  PCO2ART 40.5 42.5  PO2ART 177* 83.9  HCO3 23.7 27.2  O2SAT 99.3 96.2    Liver Function Tests: Recent Labs  Lab 12/25/18 0930 12/27/18 0752  AST 19  --   ALT 15  --   ALKPHOS 70  --   BILITOT 0.8  --   PROT 8.0  --   ALBUMIN 2.9* 2.5*   No results for input(s): LIPASE, AMYLASE in the last 168 hours. Recent Labs  Lab 12/25/18 0735 12/30/18 0809  AMMONIA 47* 77*    CBC: Recent Labs  Lab 12/25/18 0923  12/26/18 0928 12/27/18 0752 12/28/18 0845 12/30/18 1125  WBC 10.3 13.8* 9.1 9.8 10.2  HGB 13.3 13.9 10.9* 12.0* 11.3*  HCT 40.4 41.9 32.6* 36.8* 34.1*  MCV 90.0 89.1 88.1 89.1 88.3  PLT 366 428* 343 422* 438*    Cardiac Enzymes: No results for input(s): CKTOTAL, CKMB, CKMBINDEX, TROPONINI in the last 168 hours.  BNP (last 3 results) No results for input(s): BNP in the last 8760 hours.  ProBNP (last 3 results) No results for input(s): PROBNP in the last 8760 hours.  Radiological Exams: Dg Chest Port 1 View  Result Date: 12/31/2018 CLINICAL DATA:  Low-grade fever EXAM: PORTABLE CHEST 1 VIEW COMPARISON:  12/26/2018 FINDINGS: Cardiac shadow is stable. Left-sided PICC line is again noted in the proximal superior vena cava. Endotracheal tube and gastric catheter are noted in satisfactory position. Increasing bibasilar atelectatic changes are noted. IMPRESSION: Increasing bibasilar atelectasis. Electronically Signed   By: Alcide CleverMark  Lukens M.D.   On: 12/31/2018 14:44   Dg Abd Portable 1v  Result Date: 12/29/2018 CLINICAL DATA:  Nasogastric tube placement EXAM: PORTABLE ABDOMEN - 1 VIEW COMPARISON:  Portable exam 1554 hours compared to 12/24/2018 FINDINGS: Tip of nasogastric tube projects over distal gastric antrum. No bowel distention. Bones  demineralized. Prior lumbar fusion. IMPRESSION: Tip of nasogastric tube projects over distal gastric antrum. Electronically Signed   By: Lavonia Dana M.D.   On: 12/29/2018 16:29    Assessment/Plan Active Problems:   Acute on chronic respiratory failure with hypoxia (HCC)   Severe sepsis (HCC)   Acute metabolic encephalopathy   Seizure disorder (HCC)   Polypharmacy   Bipolar 2 disorder ([place] Hays)   1. Acute on chronic respiratory failure with hypoxia patient will continue to wean on pressure support 12/5 with FiO2 28% for goal of 16 hours today.  Continue to wean as tolerated per protocol.  Continue supportive measures. 2. Severe sepsis hemodynamically  stable resolved we will continue to follow along 3. Metabolic encephalopathy grossly unchanged we will continue with present management. 4. Seizure disorder no active seizure noted 5. Polypharmacy followed by our pharmacist 6. Bipolar disorder present management   I have personally seen and evaluated the patient, evaluated laboratory and imaging results, formulated the assessment and plan and placed orders. The Patient requires high complexity decision making for assessment and support.  Case was discussed on Rounds with the Respiratory Therapy Staff  [name], MD [facility] Pulmonary Critical Care Medicine Sleep Medicine

## 2019-01-01 DIAGNOSIS — J9621 Acute and chronic respiratory failure with hypoxia: Secondary | ICD-10-CM | POA: Diagnosis not present

## 2019-01-01 DIAGNOSIS — G9341 Metabolic encephalopathy: Secondary | ICD-10-CM | POA: Diagnosis not present

## 2019-01-01 DIAGNOSIS — F3181 Bipolar II disorder: Secondary | ICD-10-CM | POA: Diagnosis not present

## 2019-01-01 DIAGNOSIS — Z79899 Other long term (current) drug therapy: Secondary | ICD-10-CM | POA: Diagnosis not present

## 2019-01-01 LAB — MAGNESIUM: Magnesium: 1.8 mg/dL (ref 1.7–2.4)

## 2019-01-01 LAB — BASIC METABOLIC PANEL
Anion gap: 12 (ref 5–15)
BUN: 15 mg/dL (ref 6–20)
CO2: 27 mmol/L (ref 22–32)
Calcium: 9 mg/dL (ref 8.9–10.3)
Chloride: 92 mmol/L — ABNORMAL LOW (ref 98–111)
Creatinine, Ser: 0.8 mg/dL (ref 0.61–1.24)
GFR calc Af Amer: >60 mL/min (ref >60)
GFR calc non Af Amer: >60 mL/min (ref >60)
Glucose, Bld: 311 mg/dL — ABNORMAL HIGH (ref 70–99)
Potassium: 4.5 mmol/L (ref 3.5–5.1)
Sodium: 131 mmol/L — ABNORMAL LOW (ref 135–145)

## 2019-01-01 LAB — URINE CULTURE: Culture: NO GROWTH

## 2019-01-01 LAB — CBC
HCT: 32 % — ABNORMAL LOW (ref 39.0–52.0)
Hemoglobin: 10.7 g/dL — ABNORMAL LOW (ref 13.0–17.0)
MCH: 29.6 pg (ref 26.0–34.0)
MCHC: 33.4 g/dL (ref 30.0–36.0)
MCV: 88.4 fL (ref 80.0–100.0)
Platelets: 435 10*3/uL — ABNORMAL HIGH (ref 150–400)
RBC: 3.62 MIL/uL — ABNORMAL LOW (ref 4.22–5.81)
RDW: 12.7 % (ref 11.5–15.5)
WBC: 14.9 10*3/uL — ABNORMAL HIGH (ref 4.0–10.5)
nRBC: 0 % (ref 0.0–0.2)

## 2019-01-01 LAB — LACTIC ACID, PLASMA
Lactic Acid, Venous: 2 mmol/L (ref 0.5–1.9)
Lactic Acid, Venous: 1.2 mmol/L (ref 0.5–1.9)

## 2019-01-01 NOTE — Progress Notes (Addendum)
Pulmonary Critical Care Medicine [place]   PULMONARY CRITICAL CARE SERVICE  PROGRESS NOTE  Date of Service: 01/01/2019  Martin Hays  ERD:[id number]  DOB: [birth year]   DOA: 12/24/2018  Referring Physician: Merton Border, MD  HPI: Martin Hays is a 53 y.o. male seen for follow up of Acute on Chronic Respiratory Failure.  Patient continues on pressure support 12/5 with an FiO2 of 28%.  Patient is a goal of 20 hours at this time pressure support.  Satting well with no distress at this time.  Medications: Reviewed on Rounds  Physical Exam:  Vitals: Pulse 112 respirations 17 BP 99/59 O2 sat 97% temp 100.3  Ventilator Settings per support 12/5 FiO2 28%  . General: Comfortable at this time . Eyes: Grossly normal lids, irises & conjunctiva . ENT: grossly tongue is normal . Neck: no obvious mass . Cardiovascular: S1 S2 normal no gallop . Respiratory: No rales or rhonchi noted . Abdomen: soft . Skin: no rash seen on limited exam . Musculoskeletal: not rigid . Psychiatric:unable to assess . Neurologic: no seizure no involuntary movements         Lab Data:   Basic Metabolic Panel: Recent Labs  Lab 12/26/18 0928 12/27/18 0752 12/28/18 0845 12/30/18 1125 01/01/19 0717  NA 137 134* 135 136 131*  K 4.7 4.0 4.5 4.7 4.5  CL 100 99 101 98 92*  CO2 24 23 25 28 27   GLUCOSE 181* 206* 277* 287* 311*  BUN 5* 11 9 11 15   CREATININE 0.69 0.76 0.61 0.65 0.80  CALCIUM 9.3 8.7* 8.9 9.3 9.0  MG 1.7 2.0 1.9 1.8 1.8  PHOS 3.0 3.1 3.1  --   --     ABG: Recent Labs  Lab 12/26/18 0530  PHART 7.422  PCO2ART 42.5  PO2ART 83.9  HCO3 27.2  O2SAT 96.2    Liver Function Tests: Recent Labs  Lab 12/27/18 0752  ALBUMIN 2.5*   No results for input(s): LIPASE, AMYLASE in the last 168 hours. Recent Labs  Lab 12/30/18 0809  AMMONIA 77*    CBC: Recent Labs  Lab 12/26/18 0928 12/27/18 0752 12/28/18 0845 12/30/18 1125 01/01/19 0717  WBC 13.8* 9.1 9.8  10.2 14.9*  HGB 13.9 10.9* 12.0* 11.3* 10.7*  HCT 41.9 32.6* 36.8* 34.1* 32.0*  MCV 89.1 88.1 89.1 88.3 88.4  PLT 428* 343 422* 438* 435*    Cardiac Enzymes: No results for input(s): CKTOTAL, CKMB, CKMBINDEX, TROPONINI in the last 168 hours.  BNP (last 3 results) No results for input(s): BNP in the last 8760 hours.  ProBNP (last 3 results) No results for input(s): PROBNP in the last 8760 hours.  Radiological Exams: Dg Chest Port 1 View  Result Date: 12/31/2018 CLINICAL DATA:  Low-grade fever EXAM: PORTABLE CHEST 1 VIEW COMPARISON:  12/26/2018 FINDINGS: Cardiac shadow is stable. Left-sided PICC line is again noted in the proximal superior vena cava. Endotracheal tube and gastric catheter are noted in satisfactory position. Increasing bibasilar atelectatic changes are noted. IMPRESSION: Increasing bibasilar atelectasis. Electronically Signed   By: Inez Catalina M.D.   On: 12/31/2018 14:44    Assessment/Plan Active Problems:   Acute on chronic respiratory failure with hypoxia (HCC)   Severe sepsis (HCC)   Acute metabolic encephalopathy   Seizure disorder (HCC)   Polypharmacy   Bipolar 2 disorder ([place])   1. Acute on chronic respiratory failure with hypoxia patient will continue to wean on pressure support 12/5 with FiO2 28% for goal of 20 hours today.  Continue to wean as tolerated per protocol.  Continue supportive measures. 2. Severe sepsis hemodynamically stable resolved we will continue to follow along 3. Metabolic encephalopathy grossly unchanged we will continue with present management. 4. Seizure disorder no active seizure noted 5. Polypharmacy followed by our pharmacist 6. Bipolar disorder present management   I have personally seen and evaluated the patient, evaluated laboratory and imaging results, formulated the assessment and plan and placed orders. The Patient requires high complexity decision making for assessment and support.  Case was discussed on Rounds with the  Respiratory Therapy Staff  Yevonne PaxSaadat A Niemah Schwebke, MD [facility] Pulmonary Critical Care Medicine Sleep Medicine

## 2019-01-02 ENCOUNTER — Other Ambulatory Visit (HOSPITAL_COMMUNITY): Payer: Self-pay

## 2019-01-02 DIAGNOSIS — G9341 Metabolic encephalopathy: Secondary | ICD-10-CM | POA: Diagnosis not present

## 2019-01-02 DIAGNOSIS — F3181 Bipolar II disorder: Secondary | ICD-10-CM | POA: Diagnosis not present

## 2019-01-02 DIAGNOSIS — J9621 Acute and chronic respiratory failure with hypoxia: Secondary | ICD-10-CM | POA: Diagnosis not present

## 2019-01-02 DIAGNOSIS — Z79899 Other long term (current) drug therapy: Secondary | ICD-10-CM | POA: Diagnosis not present

## 2019-01-02 LAB — CBC
HCT: 33.6 % — ABNORMAL LOW (ref 39.0–52.0)
Hemoglobin: 11.2 g/dL — ABNORMAL LOW (ref 13.0–17.0)
MCH: 29.6 pg (ref 26.0–34.0)
MCHC: 33.3 g/dL (ref 30.0–36.0)
MCV: 88.9 fL (ref 80.0–100.0)
Platelets: 391 10*3/uL (ref 150–400)
RBC: 3.78 MIL/uL — ABNORMAL LOW (ref 4.22–5.81)
RDW: 12.9 % (ref 11.5–15.5)
WBC: 14.5 10*3/uL — ABNORMAL HIGH (ref 4.0–10.5)
nRBC: 0 % (ref 0.0–0.2)

## 2019-01-02 LAB — BASIC METABOLIC PANEL
Anion gap: 9 (ref 5–15)
BUN: 14 mg/dL (ref 6–20)
CO2: 29 mmol/L (ref 22–32)
Calcium: 9 mg/dL (ref 8.9–10.3)
Chloride: 97 mmol/L — ABNORMAL LOW (ref 98–111)
Creatinine, Ser: 0.57 mg/dL — ABNORMAL LOW (ref 0.61–1.24)
GFR calc Af Amer: >60 mL/min (ref >60)
GFR calc non Af Amer: >60 mL/min (ref >60)
Glucose, Bld: 266 mg/dL — ABNORMAL HIGH (ref 70–99)
Potassium: 5.3 mmol/L — ABNORMAL HIGH (ref 3.5–5.1)
Sodium: 135 mmol/L (ref 135–145)

## 2019-01-02 LAB — CULTURE, RESPIRATORY W GRAM STAIN: Culture: NORMAL

## 2019-01-02 LAB — MAGNESIUM: Magnesium: 1.8 mg/dL (ref 1.7–2.4)

## 2019-01-02 LAB — VANCOMYCIN, TROUGH: Vancomycin Tr: 16 ug/mL (ref 15–20)

## 2019-01-02 LAB — LEVETIRACETAM LEVEL: Levetiracetam Lvl: 22.4 ug/mL (ref 10.0–40.0)

## 2019-01-02 NOTE — Progress Notes (Addendum)
[facility]   PULMONARY CRITICAL CARE SERVICE  PROGRESS NOTE  Date of Service: 01/02/2019  Martin BerkshireJohnny Hays  ZOX:[id number]  DOB: 1966-02-16   DOA: 12/24/2018  Referring Physician: Carron CurieAli Hijazi, MD  HPI: Martin DaftJohnny Hays is a 53 y.o. male seen for follow up of Acute on Chronic Respiratory Failure.  Patient remains on pressure support 12/5 with FiO2 28%.  Has a goal 24 hours on the settings.  Satting well currently with no distress.  Medications: Reviewed on Rounds  Physical Exam:  Vitals: Pulse 108 respirations 13 BP 124/74 O2 sat 84% temp 97.4  Ventilator Settings support 12/5 FiO2 28%  . General: Comfortable at this time . Eyes: Grossly normal lids, irises & conjunctiva . ENT: grossly tongue is normal . Neck: no obvious mass . Cardiovascular: S1 S2 normal no gallop . Respiratory: No rales or rhonchi noted . Abdomen: soft . Skin: no rash seen on limited exam . Musculoskeletal: not rigid . Psychiatric:unable to assess . Neurologic: no seizure no involuntary movements         Lab Data:   Basic Metabolic Panel: Recent Labs  Lab 12/27/18 0752 12/28/18 0845 12/30/18 1125 01/01/19 0717 01/02/19 0439  NA 134* 135 136 131* 135  K 4.0 4.5 4.7 4.5 5.3*  CL 99 101 98 92* 97*  CO2 23 25 28 27 29   GLUCOSE 206* 277* 287* 311* 266*  BUN 11 9 11 15 14   CREATININE 0.76 0.61 0.65 0.80 0.57*  CALCIUM 8.7* 8.9 9.3 9.0 9.0  MG 2.0 1.9 1.8 1.8 1.8  PHOS 3.1 3.1  --   --   --     ABG: No results for input(s): PHART, PCO2ART, PO2ART, HCO3, O2SAT in the last 168 hours.  Liver Function Tests: Recent Labs  Lab 12/27/18 0752  ALBUMIN 2.5*   No results for input(s): LIPASE, AMYLASE in the last 168 hours. Recent Labs  Lab 12/30/18 0809  AMMONIA 77*    CBC: Recent Labs  Lab 12/27/18 0752 12/28/18 0845 12/30/18 1125 01/01/19 0717 01/02/19 0439  WBC 9.1 9.8 10.2 14.9* 14.5*  HGB 10.9* 12.0* 11.3* 10.7* 11.2*  HCT  32.6* 36.8* 34.1* 32.0* 33.6*  MCV 88.1 89.1 88.3 88.4 88.9  PLT 343 422* 438* 435* 391    Cardiac Enzymes: No results for input(s): CKTOTAL, CKMB, CKMBINDEX, TROPONINI in the last 168 hours.  BNP (last 3 results) No results for input(s): BNP in the last 8760 hours.  ProBNP (last 3 results) No results for input(s): PROBNP in the last 8760 hours.  Radiological Exams: Dg Chest Port 1 View  Result Date: 01/02/2019 CLINICAL DATA:  Flu EXAM: PORTABLE CHEST 1 VIEW COMPARISON:  December 31, 2018 FINDINGS: The heart size and mediastinal contours are stable. Endotracheal tube is identified in good position with distal tip 6.6 cm from carina. Nasogastric tube is identified distal tip not included on film but is at least in the stomach. Left central venous line is noted with distal tip in the superior vena cava. There is a small left pleural effusion with patchy consolidation of left lung base slightly worse compared prior exam. The right lung is clear. The visualized skeletal structures are stable. IMPRESSION: Life supporting devices as described. Small left pleural effusion with consolidation of left lung base, slightly worse compared prior exam. Electronically Signed   By: Sherian ReinWei-Chen  Lin M.D.   On: 01/02/2019 07:42    Assessment/Plan Active Problems:   Acute on chronic respiratory failure with hypoxia (HCC)  Severe sepsis ([place])   Acute metabolic encephalopathy   Seizure disorder (HCC)   Polypharmacy   Bipolar 2 disorder ([place])   1. Acute on chronic respiratory failure with hypoxiapatient will continue to wean on pressure support 12/5 with FiO2 28% for goal of 24 hours today. Continue to wean as tolerated per protocol. Continue supportive measures. 2. Severe sepsis hemodynamically stable resolved we will continue to follow along 3. Metabolic encephalopathy grossly unchanged we will continue with present management. 4. Seizure disorder no active seizure noted 5. Polypharmacy followed by our  pharmacist 6. Bipolar disorder present management   I have personally seen and evaluated the patient, evaluated laboratory and imaging results, formulated the assessment and plan and placed orders. The Patient requires high complexity decision making for assessment and support.  Case was discussed on Rounds with the Respiratory Therapy Staff  [name], MD [facility] Pulmonary Critical Care Medicine Sleep Medicine

## 2019-01-03 ENCOUNTER — Other Ambulatory Visit (HOSPITAL_COMMUNITY): Payer: Self-pay

## 2019-01-03 DIAGNOSIS — F3181 Bipolar II disorder: Secondary | ICD-10-CM | POA: Diagnosis not present

## 2019-01-03 DIAGNOSIS — J9621 Acute and chronic respiratory failure with hypoxia: Secondary | ICD-10-CM | POA: Diagnosis not present

## 2019-01-03 DIAGNOSIS — G9341 Metabolic encephalopathy: Secondary | ICD-10-CM | POA: Diagnosis not present

## 2019-01-03 DIAGNOSIS — Z79899 Other long term (current) drug therapy: Secondary | ICD-10-CM | POA: Diagnosis not present

## 2019-01-03 LAB — POTASSIUM: Potassium: 3.6 mmol/L (ref 3.5–5.1)

## 2019-01-03 NOTE — Progress Notes (Signed)
Pulmonary Critical Care Medicine [place]   PULMONARY CRITICAL CARE SERVICE  PROGRESS NOTE  Date of Service: 01/03/2019  Chad Flemmings  ZOX:[id number]  DOB: 1966/02/17   DOA: 12/24/2018  Referring Physician: Merton Border, MD  HPI: Vibhav Waddill is a 53 y.o. male seen for follow up of Acute on Chronic Respiratory Failure.  Patient is on full support on assist control mode is on 45% FiO2 pulmonary edema noted on the last chest x-ray  Medications: Reviewed on Rounds  Physical Exam:  Vitals: Temperature 100.6 pulse 130 respiratory 21 blood pressure 177/61 saturations 98%  Ventilator Settings mode ventilation assist control FiO2 45% tidal volume 680 PEEP 5  . General: Comfortable at this time . Eyes: Grossly normal lids, irises & conjunctiva . ENT: grossly tongue is normal . Neck: no obvious mass . Cardiovascular: S1 S2 normal no gallop . Respiratory: No rhonchi no rales are noted at this time . Abdomen: soft . Skin: no rash seen on limited exam . Musculoskeletal: not rigid . Psychiatric:unable to assess . Neurologic: no seizure no involuntary movements         Lab Data:   Basic Metabolic Panel: Recent Labs  Lab 12/28/18 0845 12/30/18 1125 01/01/19 0717 01/02/19 0439 01/03/19 0752  NA 135 136 131* 135  --   K 4.5 4.7 4.5 5.3* 3.6  CL 101 98 92* 97*  --   CO2 25 28 27 29   --   GLUCOSE 277* 287* 311* 266*  --   BUN 9 11 15 14   --   CREATININE 0.61 0.65 0.80 0.57*  --   CALCIUM 8.9 9.3 9.0 9.0  --   MG 1.9 1.8 1.8 1.8  --   PHOS 3.1  --   --   --   --     ABG: No results for input(s): PHART, PCO2ART, PO2ART, HCO3, O2SAT in the last 168 hours.  Liver Function Tests: No results for input(s): AST, ALT, ALKPHOS, BILITOT, PROT, ALBUMIN in the last 168 hours. No results for input(s): LIPASE, AMYLASE in the last 168 hours. Recent Labs  Lab 12/30/18 0809  AMMONIA 77*    CBC: Recent Labs  Lab 12/28/18 0845 12/30/18 1125 01/01/19 0717  01/02/19 0439  WBC 9.8 10.2 14.9* 14.5*  HGB 12.0* 11.3* 10.7* 11.2*  HCT 36.8* 34.1* 32.0* 33.6*  MCV 89.1 88.3 88.4 88.9  PLT 422* 438* 435* 391    Cardiac Enzymes: No results for input(s): CKTOTAL, CKMB, CKMBINDEX, TROPONINI in the last 168 hours.  BNP (last 3 results) No results for input(s): BNP in the last 8760 hours.  ProBNP (last 3 results) No results for input(s): PROBNP in the last 8760 hours.  Radiological Exams: Dg Abd 1 View  Result Date: 01/03/2019 CLINICAL DATA:  Nausea and vomiting. EXAM: ABDOMEN - 1 VIEW COMPARISON:  Abdominal x-ray dated December 29, 2018. FINDINGS: Enteric tube tip in the distal stomach near the pylorus. Prominent gaseous distention of the stomach. The bowel gas pattern is normal. No radio-opaque calculi or other significant radiographic abnormality are seen. Unchanged phleboliths in the pelvis. No acute osseous abnormality. Prior L4-L5 fusion and left hip arthroplasty. IMPRESSION: 1. Prominent gaseous distention of the stomach. Correlate with enteric tube function. 2. No bowel obstruction. Electronically Signed   By: Titus Dubin M.D.   On: 01/03/2019 12:23   Dg Chest Port 1 View  Result Date: 01/02/2019 CLINICAL DATA:  Flu EXAM: PORTABLE CHEST 1 VIEW COMPARISON:  December 31, 2018 FINDINGS: The heart size  and mediastinal contours are stable. Endotracheal tube is identified in good position with distal tip 6.6 cm from carina. Nasogastric tube is identified distal tip not included on film but is at least in the stomach. Left central venous line is noted with distal tip in the superior vena cava. There is a small left pleural effusion with patchy consolidation of left lung base slightly worse compared prior exam. The right lung is clear. The visualized skeletal structures are stable. IMPRESSION: Life supporting devices as described. Small left pleural effusion with consolidation of left lung base, slightly worse compared prior exam. Electronically Signed   By:  Sherian ReinWei-Chen  Lin M.D.   On: 01/02/2019 07:42    Assessment/Plan Active Problems:   Acute on chronic respiratory failure with hypoxia (HCC)   Severe sepsis (HCC)   Acute metabolic encephalopathy   Seizure disorder (HCC)   Polypharmacy   Bipolar 2 disorder (HCC)   1. Acute on chronic respiratory failure with hypoxia on full support still has significant pulmonary edema diuretics as tolerated 2. Severe sepsis hemodynamically stable 3. Acute metabolic encephalopathy unchanged 4. Seizure disorder grossly unchanged 5. Polypharmacy at baseline 6. Bipolar continue with present management   I have personally seen and evaluated the patient, evaluated laboratory and imaging results, formulated the assessment and plan and placed orders. The Patient requires high complexity decision making for assessment and support.  Case was discussed on Rounds with the Respiratory Therapy Staff  Yevonne PaxSaadat A Gala Padovano, MD [facility] Pulmonary Critical Care Medicine Sleep Medicine

## 2019-01-04 ENCOUNTER — Other Ambulatory Visit (HOSPITAL_COMMUNITY): Payer: Self-pay

## 2019-01-04 DIAGNOSIS — G9341 Metabolic encephalopathy: Secondary | ICD-10-CM | POA: Diagnosis not present

## 2019-01-04 DIAGNOSIS — Z79899 Other long term (current) drug therapy: Secondary | ICD-10-CM | POA: Diagnosis not present

## 2019-01-04 DIAGNOSIS — J9621 Acute and chronic respiratory failure with hypoxia: Secondary | ICD-10-CM | POA: Diagnosis not present

## 2019-01-04 DIAGNOSIS — F3181 Bipolar II disorder: Secondary | ICD-10-CM | POA: Diagnosis not present

## 2019-01-04 LAB — CBC
HCT: 32.4 % — ABNORMAL LOW (ref 39.0–52.0)
Hemoglobin: 10.7 g/dL — ABNORMAL LOW (ref 13.0–17.0)
MCH: 29.4 pg (ref 26.0–34.0)
MCHC: 33 g/dL (ref 30.0–36.0)
MCV: 89 fL (ref 80.0–100.0)
Platelets: 450 10*3/uL — ABNORMAL HIGH (ref 150–400)
RBC: 3.64 MIL/uL — ABNORMAL LOW (ref 4.22–5.81)
RDW: 13.2 % (ref 11.5–15.5)
WBC: 23.6 10*3/uL — ABNORMAL HIGH (ref 4.0–10.5)

## 2019-01-04 LAB — COMPREHENSIVE METABOLIC PANEL
ALT: 19 U/L (ref 0–44)
AST: 22 U/L (ref 15–41)
Albumin: 2.1 g/dL — ABNORMAL LOW (ref 3.5–5.0)
Alkaline Phosphatase: 57 U/L (ref 38–126)
Anion gap: 14 (ref 5–15)
BUN: 19 mg/dL (ref 6–20)
CO2: 25 mmol/L (ref 22–32)
Calcium: 9 mg/dL (ref 8.9–10.3)
Chloride: 99 mmol/L (ref 98–111)
Creatinine, Ser: 0.89 mg/dL (ref 0.61–1.24)
GFR calc Af Amer: >60 mL/min (ref >60)
GFR calc non Af Amer: >60 mL/min (ref >60)
Glucose, Bld: 204 mg/dL — ABNORMAL HIGH (ref 70–99)
Potassium: 3.6 mmol/L (ref 3.5–5.1)
Sodium: 138 mmol/L (ref 135–145)
Total Bilirubin: 0.6 mg/dL (ref 0.3–1.2)
Total Protein: 7.1 g/dL (ref 6.5–8.1)

## 2019-01-04 LAB — C DIFFICILE QUICK SCREEN W PCR REFLEX
C Diff antigen: NEGATIVE
C Diff interpretation: NOT DETECTED
C Diff toxin: NEGATIVE

## 2019-01-04 LAB — LACTIC ACID, PLASMA
Lactic Acid, Venous: 1.6 mmol/L (ref 0.5–1.9)
Lactic Acid, Venous: 2 mmol/L (ref 0.5–1.9)

## 2019-01-04 LAB — MAGNESIUM: Magnesium: 2 mg/dL (ref 1.7–2.4)

## 2019-01-04 NOTE — Progress Notes (Addendum)
[facility]   PULMONARY CRITICAL CARE SERVICE  PROGRESS NOTE  Date of Service: 01/04/2019  Martin BerkshireJohnny Hays  ZOX:[id number]  DOB: June 28, 1966   DOA: 12/24/2018  Referring Physician: Carron CurieAli Hijazi, MD  HPI: Martin Hays is a 53 y.o. male seen for follow up of Acute on Chronic Respiratory Failure.  Patient is on pressure support 12/5 with FiO2 45% for goal 4 hours today.  Satting well no distress.  Medications: Reviewed on Rounds  Physical Exam:  Vitals: Pulse 112 respirations 23 BP 111/75 O2 sat 99% temp 98.1  Ventilator Settings pressure support 12/5 FiO2 45%  . General: Comfortable at this time . Eyes: Grossly normal lids, irises & conjunctiva . ENT: grossly tongue is normal . Neck: no obvious mass . Cardiovascular: S1 S2 normal no gallop . Respiratory: No rales or rhonchi noted . Abdomen: soft . Skin: no rash seen on limited exam . Musculoskeletal: not rigid . Psychiatric:unable to assess . Neurologic: no seizure no involuntary movements         Lab Data:   Basic Metabolic Panel: Recent Labs  Lab 12/30/18 1125 01/01/19 0717 01/02/19 0439 01/03/19 0752 01/04/19 0542  NA 136 131* 135  --  138  K 4.7 4.5 5.3* 3.6 3.6  CL 98 92* 97*  --  99  CO2 28 27 29   --  25  GLUCOSE 287* 311* 266*  --  204*  BUN 11 15 14   --  19  CREATININE 0.65 0.80 0.57*  --  0.89  CALCIUM 9.3 9.0 9.0  --  9.0  MG 1.8 1.8 1.8  --  2.0    ABG: No results for input(s): PHART, PCO2ART, PO2ART, HCO3, O2SAT in the last 168 hours.  Liver Function Tests: Recent Labs  Lab 01/04/19 0542  AST 22  ALT 19  ALKPHOS 57  BILITOT 0.6  PROT 7.1  ALBUMIN 2.1*   No results for input(s): LIPASE, AMYLASE in the last 168 hours. Recent Labs  Lab 12/30/18 0809  AMMONIA 77*    CBC: Recent Labs  Lab 12/30/18 1125 01/01/19 0717 01/02/19 0439 01/04/19 0542  WBC 10.2 14.9* 14.5* 23.6*  HGB 11.3* 10.7* 11.2* 10.7*  HCT 34.1* 32.0* 33.6*  32.4*  MCV 88.3 88.4 88.9 89.0  PLT 438* 435* 391 450*    Cardiac Enzymes: No results for input(s): CKTOTAL, CKMB, CKMBINDEX, TROPONINI in the last 168 hours.  BNP (last 3 results) No results for input(s): BNP in the last 8760 hours.  ProBNP (last 3 results) No results for input(s): PROBNP in the last 8760 hours.  Radiological Exams: Dg Abd 1 View  Result Date: 01/03/2019 CLINICAL DATA:  Nausea and vomiting. EXAM: ABDOMEN - 1 VIEW COMPARISON:  Abdominal x-ray dated December 29, 2018. FINDINGS: Enteric tube tip in the distal stomach near the pylorus. Prominent gaseous distention of the stomach. The bowel gas pattern is normal. No radio-opaque calculi or other significant radiographic abnormality are seen. Unchanged phleboliths in the pelvis. No acute osseous abnormality. Prior L4-L5 fusion and left hip arthroplasty. IMPRESSION: 1. Prominent gaseous distention of the stomach. Correlate with enteric tube function. 2. No bowel obstruction. Electronically Signed   By: Obie DredgeWilliam T Derry M.D.   On: 01/03/2019 12:23   Dg Chest Port 1 View  Result Date: 01/04/2019 CLINICAL DATA:  Respiratory distress. EXAM: PORTABLE CHEST 1 VIEW COMPARISON:  Radiograph January 02, 2019. FINDINGS: The heart size and mediastinal contours are within normal limits. Endotracheal nasogastric tubes are unchanged in position.  No pneumothorax is noted. Left-sided PICC line is in good position. Right lung is clear. Left basilar atelectasis or pneumonia is noted. The visualized skeletal structures are unremarkable. IMPRESSION: Left basilar atelectasis or pneumonia is noted. Stable support apparatus. Electronically Signed   By: Marijo Conception M.D.   On: 01/04/2019 08:15    Assessment/Plan Active Problems:   Acute on chronic respiratory failure with hypoxia (HCC)   Severe sepsis (HCC)   Acute metabolic encephalopathy   Seizure disorder (HCC)   Polypharmacy   Bipolar 2 disorder ([place])   1. Acute on chronic respiratory failure with  hypoxia continue weaning per protocol as patient can tolerate.  Goal for 4 hours today on pressure support respiratory ventilator on full support assist control mode. 2. Severe sepsis hemodynamically stable 3. Acute metabolic encephalopathy unchanged 4. Seizure disorder grossly unchanged 5. Polypharmacy at baseline 6. Bipolar continue with present management   I have personally seen and evaluated the patient, evaluated laboratory and imaging results, formulated the assessment and plan and placed orders. The Patient requires high complexity decision making for assessment and support.  Case was discussed on Rounds with the Respiratory Therapy Staff  [name], MD [facility] Pulmonary Critical Care Medicine Sleep Medicine

## 2019-01-05 DIAGNOSIS — F3181 Bipolar II disorder: Secondary | ICD-10-CM | POA: Diagnosis not present

## 2019-01-05 DIAGNOSIS — Z79899 Other long term (current) drug therapy: Secondary | ICD-10-CM | POA: Diagnosis not present

## 2019-01-05 DIAGNOSIS — G9341 Metabolic encephalopathy: Secondary | ICD-10-CM | POA: Diagnosis not present

## 2019-01-05 DIAGNOSIS — J9621 Acute and chronic respiratory failure with hypoxia: Secondary | ICD-10-CM | POA: Diagnosis not present

## 2019-01-05 LAB — CBC
HCT: 32.4 % — ABNORMAL LOW (ref 39.0–52.0)
Hemoglobin: 10.3 g/dL — ABNORMAL LOW (ref 13.0–17.0)
MCH: 29.3 pg (ref 26.0–34.0)
MCHC: 31.8 g/dL (ref 30.0–36.0)
MCV: 92 fL (ref 80.0–100.0)
Platelets: 433 10*3/uL — ABNORMAL HIGH (ref 150–400)
RBC: 3.52 MIL/uL — ABNORMAL LOW (ref 4.22–5.81)
RDW: 13 % (ref 11.5–15.5)
WBC: 18.5 10*3/uL — ABNORMAL HIGH (ref 4.0–10.5)
nRBC: 0 % (ref 0.0–0.2)

## 2019-01-05 LAB — CULTURE, BLOOD (ROUTINE X 2)
Culture: NO GROWTH
Culture: NO GROWTH
Special Requests: ADEQUATE
Special Requests: ADEQUATE

## 2019-01-05 LAB — LACTIC ACID, PLASMA: Lactic Acid, Venous: 1.8 mmol/L (ref 0.5–1.9)

## 2019-01-05 NOTE — Progress Notes (Addendum)
Pulmonary Critical Care Medicine [place]   PULMONARY CRITICAL CARE SERVICE  PROGRESS NOTE  Date of Service: 01/05/2019  Key Flemmings  GEX:[id number]  DOB: 02/15/1966   DOA: 12/24/2018  Referring Physician: Merton Border, MD  HPI: Genesis Paget is a 53 y.o. male seen for follow up of Acute on Chronic Respiratory Failure.  Patient was on pressure support this time 12/5 FiO2 of 35% for goal of 8 hours today.  Currently satting well with no fever or distress.  Medications: Reviewed on Rounds  Physical Exam:  Vitals: Pulse 88 respirations 19 BP 96/55 O2 sat 98% temp 98.5  Ventilator Settings pressure support 12/5 FiO2 35%  . General: Comfortable at this time . Eyes: Grossly normal lids, irises & conjunctiva . ENT: grossly tongue is normal . Neck: no obvious mass . Cardiovascular: S1 S2 normal no gallop . Respiratory: No wheeze or rhonchi noted . Abdomen: soft . Skin: no rash seen on limited exam . Musculoskeletal: not rigid . Psychiatric:unable to assess . Neurologic: no seizure no involuntary movements         Lab Data:   Basic Metabolic Panel: Recent Labs  Lab 12/30/18 1125 01/01/19 0717 01/02/19 0439 01/03/19 0752 01/04/19 0542  NA 136 131* 135  --  138  K 4.7 4.5 5.3* 3.6 3.6  CL 98 92* 97*  --  99  CO2 28 27 29   --  25  GLUCOSE 287* 311* 266*  --  204*  BUN 11 15 14   --  19  CREATININE 0.65 0.80 0.57*  --  0.89  CALCIUM 9.3 9.0 9.0  --  9.0  MG 1.8 1.8 1.8  --  2.0    ABG: No results for input(s): PHART, PCO2ART, PO2ART, HCO3, O2SAT in the last 168 hours.  Liver Function Tests: Recent Labs  Lab 01/04/19 0542  AST 22  ALT 19  ALKPHOS 57  BILITOT 0.6  PROT 7.1  ALBUMIN 2.1*   No results for input(s): LIPASE, AMYLASE in the last 168 hours. Recent Labs  Lab 12/30/18 0809  AMMONIA 77*    CBC: Recent Labs  Lab 12/30/18 1125 01/01/19 0717 01/02/19 0439 01/04/19 0542 01/05/19 0537  WBC 10.2 14.9* 14.5* 23.6*  18.5*  HGB 11.3* 10.7* 11.2* 10.7* 10.3*  HCT 34.1* 32.0* 33.6* 32.4* 32.4*  MCV 88.3 88.4 88.9 89.0 92.0  PLT 438* 435* 391 450* 433*    Cardiac Enzymes: No results for input(s): CKTOTAL, CKMB, CKMBINDEX, TROPONINI in the last 168 hours.  BNP (last 3 results) No results for input(s): BNP in the last 8760 hours.  ProBNP (last 3 results) No results for input(s): PROBNP in the last 8760 hours.  Radiological Exams: Dg Chest Port 1 View  Result Date: 01/04/2019 CLINICAL DATA:  Respiratory distress. EXAM: PORTABLE CHEST 1 VIEW COMPARISON:  Radiograph January 02, 2019. FINDINGS: The heart size and mediastinal contours are within normal limits. Endotracheal nasogastric tubes are unchanged in position. No pneumothorax is noted. Left-sided PICC line is in good position. Right lung is clear. Left basilar atelectasis or pneumonia is noted. The visualized skeletal structures are unremarkable. IMPRESSION: Left basilar atelectasis or pneumonia is noted. Stable support apparatus. Electronically Signed   By: Marijo Conception M.D.   On: 01/04/2019 08:15    Assessment/Plan Active Problems:   Acute on chronic respiratory failure with hypoxia (HCC)   Severe sepsis (HCC)   Acute metabolic encephalopathy   Seizure disorder (HCC)   Polypharmacy   Bipolar 2 disorder ([place])  1. Acute on chronic respiratory failure with hypoxia continue weaning per protocol as patient can tolerate.  Goal for 8 hours today on pressure support respiratory ventilator on full support assist control mode. 2. Severe sepsis hemodynamically stable 3. Acute metabolic encephalopathy unchanged 4. Seizure disorder grossly unchanged 5. Polypharmacy at baseline 6. Bipolar continue with present management   I have personally seen and evaluated the patient, evaluated laboratory and imaging results, formulated the assessment and plan and placed orders. The Patient requires high complexity decision making for assessment and support.  Case  was discussed on Rounds with the Respiratory Therapy Staff  Yevonne PaxSaadat A Khan, MD [facility] Pulmonary Critical Care Medicine Sleep Medicine

## 2019-01-06 DIAGNOSIS — J9621 Acute and chronic respiratory failure with hypoxia: Secondary | ICD-10-CM | POA: Diagnosis not present

## 2019-01-06 DIAGNOSIS — F3181 Bipolar II disorder: Secondary | ICD-10-CM | POA: Diagnosis not present

## 2019-01-06 DIAGNOSIS — G9341 Metabolic encephalopathy: Secondary | ICD-10-CM | POA: Diagnosis not present

## 2019-01-06 DIAGNOSIS — Z79899 Other long term (current) drug therapy: Secondary | ICD-10-CM | POA: Diagnosis not present

## 2019-01-06 LAB — CBC
HCT: 31.1 % — ABNORMAL LOW (ref 39.0–52.0)
Hemoglobin: 10.2 g/dL — ABNORMAL LOW (ref 13.0–17.0)
MCH: 29.7 pg (ref 26.0–34.0)
MCHC: 32.8 g/dL (ref 30.0–36.0)
MCV: 90.4 fL (ref 80.0–100.0)
Platelets: 451 10*3/uL — ABNORMAL HIGH (ref 150–400)
RBC: 3.44 MIL/uL — ABNORMAL LOW (ref 4.22–5.81)
RDW: 12.7 % (ref 11.5–15.5)
WBC: 13.2 10*3/uL — ABNORMAL HIGH (ref 4.0–10.5)
nRBC: 0 % (ref 0.0–0.2)

## 2019-01-06 LAB — BASIC METABOLIC PANEL
Anion gap: 12 (ref 5–15)
BUN: 12 mg/dL (ref 6–20)
CO2: 25 mmol/L (ref 22–32)
Calcium: 8.6 mg/dL — ABNORMAL LOW (ref 8.9–10.3)
Chloride: 104 mmol/L (ref 98–111)
Creatinine, Ser: 0.6 mg/dL — ABNORMAL LOW (ref 0.61–1.24)
GFR calc Af Amer: >60 mL/min (ref >60)
GFR calc non Af Amer: >60 mL/min (ref >60)
Glucose, Bld: 142 mg/dL — ABNORMAL HIGH (ref 70–99)
Potassium: 3.5 mmol/L (ref 3.5–5.1)
Sodium: 141 mmol/L (ref 135–145)

## 2019-01-06 LAB — AMMONIA: Ammonia: 26 umol/L (ref 9–35)

## 2019-01-06 LAB — MAGNESIUM: Magnesium: 2.1 mg/dL (ref 1.7–2.4)

## 2019-01-06 NOTE — Progress Notes (Signed)
Pulmonary Critical Care Medicine [place]   PULMONARY CRITICAL CARE SERVICE  PROGRESS NOTE  Date of Service: 01/06/2019  [name]  VEL:[id number]  DOB: August 13, 1965   DOA: 12/24/2018  Referring Physician: Merton Border, MD  HPI: Martin Hays is a 53 y.o. male seen for follow up of Acute on Chronic Respiratory Failure.  He actually looks good today has been on pressure support mode on 35% FiO2 12/5  Medications: Reviewed on Rounds  Physical Exam:  Vitals: Temperature 98.6 pulse 72 respiratory rate 23 blood pressure 145/89 saturations 100%  Ventilator Settings mode ventilation pressure support FiO2 35% tidal volume 450 pressure poor 12 PEEP 5  . General: Comfortable at this time . Eyes: Grossly normal lids, irises & conjunctiva . ENT: grossly tongue is normal . Neck: no obvious mass . Cardiovascular: S1 S2 normal no gallop . Respiratory: No rhonchi no rales are noted at this time . Abdomen: soft . Skin: no rash seen on limited exam . Musculoskeletal: not rigid . Psychiatric:unable to assess . Neurologic: no seizure no involuntary movements         Lab Data:   Basic Metabolic Panel: Recent Labs  Lab 01/01/19 0717 01/02/19 0439 01/03/19 0752 01/04/19 0542 01/06/19 0653  NA 131* 135  --  138 141  K 4.5 5.3* 3.6 3.6 3.5  CL 92* 97*  --  99 104  CO2 27 29  --  25 25  GLUCOSE 311* 266*  --  204* 142*  BUN 15 14  --  19 12  CREATININE 0.80 0.57*  --  0.89 0.60*  CALCIUM 9.0 9.0  --  9.0 8.6*  MG 1.8 1.8  --  2.0 2.1    ABG: No results for input(s): PHART, PCO2ART, PO2ART, HCO3, O2SAT in the last 168 hours.  Liver Function Tests: Recent Labs  Lab 01/04/19 0542  AST 22  ALT 19  ALKPHOS 57  BILITOT 0.6  PROT 7.1  ALBUMIN 2.1*   No results for input(s): LIPASE, AMYLASE in the last 168 hours. Recent Labs  Lab 01/06/19 0653  AMMONIA 26    CBC: Recent Labs  Lab 01/01/19 0717 01/02/19 0439 01/04/19 0542 01/05/19 0537  01/06/19 0653  WBC 14.9* 14.5* 23.6* 18.5* 13.2*  HGB 10.7* 11.2* 10.7* 10.3* 10.2*  HCT 32.0* 33.6* 32.4* 32.4* 31.1*  MCV 88.4 88.9 89.0 92.0 90.4  PLT 435* 391 450* 433* 451*    Cardiac Enzymes: No results for input(s): CKTOTAL, CKMB, CKMBINDEX, TROPONINI in the last 168 hours.  BNP (last 3 results) No results for input(s): BNP in the last 8760 hours.  ProBNP (last 3 results) No results for input(s): PROBNP in the last 8760 hours.  Radiological Exams: No results found.  Assessment/Plan Active Problems:   Acute on chronic respiratory failure with hypoxia (HCC)   Severe sepsis (HCC)   Acute metabolic encephalopathy   Seizure disorder (HCC)   Polypharmacy   Bipolar 2 disorder ([place])   1. Acute on chronic respiratory failure with hypoxia we will continue with pressure support mode goal is about 12 hours and we will advance it to as tolerated 2. Severe sepsis hemodynamics are stable we will continue with supportive care 3. Acute metabolic encephalopathy grossly unchanged 4. Seizure disorder no changes are noted no active seizures 5. Polypharmacy working with our pharmacist 6. Bipolar disorder patient looks clinically improved with medication adjustment   I have personally seen and evaluated the patient, evaluated laboratory and imaging results, formulated the assessment and plan  and placed orders. The Patient requires high complexity decision making for assessment and support.  Case was discussed on Rounds with the Respiratory Therapy Staff  [name], MD [facility] Pulmonary Critical Care Medicine Sleep Medicine

## 2019-01-07 DIAGNOSIS — F3181 Bipolar II disorder: Secondary | ICD-10-CM | POA: Diagnosis not present

## 2019-01-07 DIAGNOSIS — G9341 Metabolic encephalopathy: Secondary | ICD-10-CM | POA: Diagnosis not present

## 2019-01-07 DIAGNOSIS — J9621 Acute and chronic respiratory failure with hypoxia: Secondary | ICD-10-CM | POA: Diagnosis not present

## 2019-01-07 DIAGNOSIS — Z79899 Other long term (current) drug therapy: Secondary | ICD-10-CM | POA: Diagnosis not present

## 2019-01-07 NOTE — Progress Notes (Addendum)
Pulmonary Critical Care Medicine [place]   PULMONARY CRITICAL CARE SERVICE  PROGRESS NOTE  Date of Service: 01/07/2019  Martin Hays  URK:[id number]  DOB: September 20, 1965   DOA: 12/24/2018  Referring Physician: Merton Border, MD  HPI: Martin Hays is a 53 y.o. male seen for follow up of Acute on Chronic Respiratory Failure.  Patient was able to tolerate 16 hours on pressure support yesterday and today remains on 12/5 FiO2 of 30% as tolerated.  Medications: Reviewed on Rounds  Physical Exam:  Vitals: Pulse 78 respiration 22 BP 140/60 O2 sat 100% temp 96.3  Ventilator Settings pressure support 12/5 FiO2 30%  . General: Comfortable at this time . Eyes: Grossly normal lids, irises & conjunctiva . ENT: grossly tongue is normal . Neck: no obvious mass . Cardiovascular: S1 S2 normal no gallop . Respiratory: No rales or rhonchi noted . Abdomen: soft . Skin: no rash seen on limited exam . Musculoskeletal: not rigid . Psychiatric:unable to assess . Neurologic: no seizure no involuntary movements         Lab Data:   Basic Metabolic Panel: Recent Labs  Lab 01/01/19 0717 01/02/19 0439 01/03/19 0752 01/04/19 0542 01/06/19 0653  NA 131* 135  --  138 141  K 4.5 5.3* 3.6 3.6 3.5  CL 92* 97*  --  99 104  CO2 27 29  --  25 25  GLUCOSE 311* 266*  --  204* 142*  BUN 15 14  --  19 12  CREATININE 0.80 0.57*  --  0.89 0.60*  CALCIUM 9.0 9.0  --  9.0 8.6*  MG 1.8 1.8  --  2.0 2.1    ABG: No results for input(s): PHART, PCO2ART, PO2ART, HCO3, O2SAT in the last 168 hours.  Liver Function Tests: Recent Labs  Lab 01/04/19 0542  AST 22  ALT 19  ALKPHOS 57  BILITOT 0.6  PROT 7.1  ALBUMIN 2.1*   No results for input(s): LIPASE, AMYLASE in the last 168 hours. Recent Labs  Lab 01/06/19 0653  AMMONIA 26    CBC: Recent Labs  Lab 01/01/19 0717 01/02/19 0439 01/04/19 0542 01/05/19 0537 01/06/19 0653  WBC 14.9* 14.5* 23.6* 18.5* 13.2*  HGB 10.7*  11.2* 10.7* 10.3* 10.2*  HCT 32.0* 33.6* 32.4* 32.4* 31.1*  MCV 88.4 88.9 89.0 92.0 90.4  PLT 435* 391 450* 433* 451*    Cardiac Enzymes: No results for input(s): CKTOTAL, CKMB, CKMBINDEX, TROPONINI in the last 168 hours.  BNP (last 3 results) No results for input(s): BNP in the last 8760 hours.  ProBNP (last 3 results) No results for input(s): PROBNP in the last 8760 hours.  Radiological Exams: No results found.  Assessment/Plan Active Problems:   Acute on chronic respiratory failure with hypoxia (HCC)   Severe sepsis (HCC)   Acute metabolic encephalopathy   Seizure disorder (HCC)   Polypharmacy   Bipolar 2 disorder ([place])   1. Acute on chronic respiratory failure with hypoxia continue using pressure support as tolerated currently on 30% FiO2.  Continue supportive measures and pulmonary toilet. 2. Severe sepsis hemodynamics are stable we will continue with supportive care 3. Acute metabolic encephalopathy grossly unchanged 4. Seizure disorder no changes are noted no active seizures 5. Polypharmacy working with our pharmacist 6. Bipolar disorder patient looks clinically improved with medication adjustment   I have personally seen and evaluated the patient, evaluated laboratory and imaging results, formulated the assessment and plan and placed orders. The Patient requires high complexity decision making for  assessment and support.  Case was discussed on Rounds with the Respiratory Therapy Staff  [name], MD [facility] Pulmonary Critical Care Medicine Sleep Medicine

## 2019-01-08 ENCOUNTER — Other Ambulatory Visit (HOSPITAL_COMMUNITY): Payer: Self-pay

## 2019-01-08 DIAGNOSIS — F3181 Bipolar II disorder: Secondary | ICD-10-CM | POA: Diagnosis not present

## 2019-01-08 DIAGNOSIS — J9621 Acute and chronic respiratory failure with hypoxia: Secondary | ICD-10-CM | POA: Diagnosis not present

## 2019-01-08 DIAGNOSIS — G9341 Metabolic encephalopathy: Secondary | ICD-10-CM | POA: Diagnosis not present

## 2019-01-08 DIAGNOSIS — Z79899 Other long term (current) drug therapy: Secondary | ICD-10-CM | POA: Diagnosis not present

## 2019-01-08 LAB — CBC
HCT: 35.8 % — ABNORMAL LOW (ref 39.0–52.0)
Hemoglobin: 11.8 g/dL — ABNORMAL LOW (ref 13.0–17.0)
MCH: 28.9 pg (ref 26.0–34.0)
MCHC: 33 g/dL (ref 30.0–36.0)
MCV: 87.7 fL (ref 80.0–100.0)
Platelets: 545 10*3/uL — ABNORMAL HIGH (ref 150–400)
RBC: 4.08 MIL/uL — ABNORMAL LOW (ref 4.22–5.81)
RDW: 12.5 % (ref 11.5–15.5)
WBC: 11.4 10*3/uL — ABNORMAL HIGH (ref 4.0–10.5)
nRBC: 0 % (ref 0.0–0.2)

## 2019-01-08 LAB — BASIC METABOLIC PANEL
Anion gap: 11 (ref 5–15)
BUN: 13 mg/dL (ref 6–20)
CO2: 27 mmol/L (ref 22–32)
Calcium: 8.7 mg/dL — ABNORMAL LOW (ref 8.9–10.3)
Chloride: 102 mmol/L (ref 98–111)
Creatinine, Ser: 0.54 mg/dL — ABNORMAL LOW (ref 0.61–1.24)
GFR calc Af Amer: >60 mL/min (ref >60)
GFR calc non Af Amer: >60 mL/min (ref >60)
Glucose, Bld: 166 mg/dL — ABNORMAL HIGH (ref 70–99)
Potassium: 3.3 mmol/L — ABNORMAL LOW (ref 3.5–5.1)
Sodium: 140 mmol/L (ref 135–145)

## 2019-01-08 LAB — MAGNESIUM: Magnesium: 2.1 mg/dL (ref 1.7–2.4)

## 2019-01-08 LAB — PHOSPHORUS: Phosphorus: 2.8 mg/dL (ref 2.5–4.6)

## 2019-01-08 NOTE — Progress Notes (Addendum)
Pulmonary Critical Care Medicine [place]   PULMONARY CRITICAL CARE SERVICE  PROGRESS NOTE  Date of Service: 01/08/2019  Martin Hays  VZC:[id number]  DOB: [birth year]   DOA: 12/24/2018  Referring Physician: Merton Border, MD  HPI: Martin Hays is a 53 y.o. male seen for follow up of Acute on Chronic Respiratory Failure.  Patient remains on pressure support 12/5 with FiO2 28% as tolerated.  Has been over 24 hours overall satting well no distress.  Medications: Reviewed on Rounds  Physical Exam:  Vitals: Pulse 74 respirations 22 BP 123/76 O2 sat 100% temp 97 1  Ventilator Settings respiratory 5 FiO2 28%  . General: Comfortable at this time . Eyes: Grossly normal lids, irises & conjunctiva . ENT: grossly tongue is normal . Neck: no obvious mass . Cardiovascular: S1 S2 normal no gallop . Respiratory: No rales or rhonchi noted . Abdomen: soft . Skin: no rash seen on limited exam . Musculoskeletal: not rigid . Psychiatric:unable to assess . Neurologic: no seizure no involuntary movements         Lab Data:   Basic Metabolic Panel: Recent Labs  Lab 01/02/19 0439 01/03/19 0752 01/04/19 0542 01/06/19 0653 01/08/19 0621  NA 135  --  138 141 140  K 5.3* 3.6 3.6 3.5 3.3*  CL 97*  --  99 104 102  CO2 29  --  25 25 27   GLUCOSE 266*  --  204* 142* 166*  BUN 14  --  19 12 13   CREATININE 0.57*  --  0.89 0.60* 0.54*  CALCIUM 9.0  --  9.0 8.6* 8.7*  MG 1.8  --  2.0 2.1 2.1  PHOS  --   --   --   --  2.8    ABG: No results for input(s): PHART, PCO2ART, PO2ART, HCO3, O2SAT in the last 168 hours.  Liver Function Tests: Recent Labs  Lab 01/04/19 0542  AST 22  ALT 19  ALKPHOS 57  BILITOT 0.6  PROT 7.1  ALBUMIN 2.1*   No results for input(s): LIPASE, AMYLASE in the last 168 hours. Recent Labs  Lab 01/06/19 0653  AMMONIA 26    CBC: Recent Labs  Lab 01/02/19 0439 01/04/19 0542 01/05/19 0537 01/06/19 0653 01/08/19 0621  WBC 14.5* 23.6*  18.5* 13.2* 11.4*  HGB 11.2* 10.7* 10.3* 10.2* 11.8*  HCT 33.6* 32.4* 32.4* 31.1* 35.8*  MCV 88.9 89.0 92.0 90.4 87.7  PLT 391 450* 433* 451* 545*    Cardiac Enzymes: No results for input(s): CKTOTAL, CKMB, CKMBINDEX, TROPONINI in the last 168 hours.  BNP (last 3 results) No results for input(s): BNP in the last 8760 hours.  ProBNP (last 3 results) No results for input(s): PROBNP in the last 8760 hours.  Radiological Exams: Dg Abd Portable 1v  Result Date: 01/08/2019 CLINICAL DATA:  Enteric feeding tube placement. EXAM: PORTABLE ABDOMEN - 1 VIEW COMPARISON:  01/03/2019 FINDINGS: New enteric tube extends below the diaphragm to curl within the proximal stomach in the left upper quadrant. Normal bowel gas pattern. IMPRESSION: 1. Enteric tube extends below the diaphragm into the proximal stomach. Metallic tip lies likely resides in the gastric fundus. Electronically Signed   By: Lajean Manes M.D.   On: 01/08/2019 16:21    Assessment/Plan Active Problems:   Acute on chronic respiratory failure with hypoxia (HCC)   Severe sepsis (HCC)   Acute metabolic encephalopathy   Seizure disorder (HCC)   Polypharmacy   Bipolar 2 disorder ([place])   1. Acute on  chronic respiratory failure with hypoxia continue using pressure support as tolerated currently on 28% FiO2.  Continue supportive measures and pulmonary toilet. 2. Severe sepsis hemodynamics are stable we will continue with supportive care 3. Acute metabolic encephalopathy grossly unchanged 4. Seizure disorder no changes are noted no active seizures 5. Polypharmacy working with our pharmacist 6. Bipolar disorder patient looks clinically improved with medication adjustment   I have personally seen and evaluated the patient, evaluated laboratory and imaging results, formulated the assessment and plan and placed orders. The Patient requires high complexity decision making for assessment and support.  Case was discussed on Rounds with the  Respiratory Therapy Staff  Yevonne PaxSaadat A Khan, MD [facility] Pulmonary Critical Care Medicine Sleep Medicine

## 2019-01-09 DIAGNOSIS — J9621 Acute and chronic respiratory failure with hypoxia: Secondary | ICD-10-CM | POA: Diagnosis not present

## 2019-01-09 DIAGNOSIS — Z79899 Other long term (current) drug therapy: Secondary | ICD-10-CM | POA: Diagnosis not present

## 2019-01-09 DIAGNOSIS — F3181 Bipolar II disorder: Secondary | ICD-10-CM | POA: Diagnosis not present

## 2019-01-09 DIAGNOSIS — G9341 Metabolic encephalopathy: Secondary | ICD-10-CM | POA: Diagnosis not present

## 2019-01-09 LAB — POTASSIUM: Potassium: 3.6 mmol/L (ref 3.5–5.1)

## 2019-01-09 NOTE — Progress Notes (Signed)
[facility]   PULMONARY CRITICAL CARE SERVICE  PROGRESS NOTE  Date of Service: 01/09/2019  Martin Hays  ZOX:[id number]  DOB: 23-May-1966   DOA: 12/24/2018  Referring Physician: Carron CurieAli Hijazi, MD  HPI: Martin Hays is a 53 y.o. male seen for follow up of Acute on Chronic Respiratory Failure.  Patient is extubated and doing well oxygen was weaned down to now room air  Medications: Reviewed on Rounds  Physical Exam:  Vitals: Temperature 98.0 pulse 85 respiratory rate 27 blood pressure 100/69 saturations 95%  Ventilator Settings off the ventilator extubated  . General: Comfortable at this time . Eyes: Grossly normal lids, irises & conjunctiva . ENT: grossly tongue is normal . Neck: no obvious mass . Cardiovascular: S1 S2 normal no gallop . Respiratory: No rhonchi no rales are noted . Abdomen: soft . Skin: no rash seen on limited exam . Musculoskeletal: not rigid . Psychiatric:unable to assess . Neurologic: no seizure no involuntary movements         Lab Data:   Basic Metabolic Panel: Recent Labs  Lab 01/03/19 0752 01/04/19 0542 01/06/19 0653 01/08/19 0621 01/09/19 0600  NA  --  138 141 140  --   K 3.6 3.6 3.5 3.3* 3.6  CL  --  99 104 102  --   CO2  --  25 25 27   --   GLUCOSE  --  204* 142* 166*  --   BUN  --  19 12 13   --   CREATININE  --  0.89 0.60* 0.54*  --   CALCIUM  --  9.0 8.6* 8.7*  --   MG  --  2.0 2.1 2.1  --   PHOS  --   --   --  2.8  --     ABG: No results for input(s): PHART, PCO2ART, PO2ART, HCO3, O2SAT in the last 168 hours.  Liver Function Tests: Recent Labs  Lab 01/04/19 0542  AST 22  ALT 19  ALKPHOS 57  BILITOT 0.6  PROT 7.1  ALBUMIN 2.1*   No results for input(s): LIPASE, AMYLASE in the last 168 hours. Recent Labs  Lab 01/06/19 0653  AMMONIA 26    CBC: Recent Labs  Lab 01/04/19 0542 01/05/19 0537 01/06/19 0653 01/08/19 0621  WBC 23.6* 18.5* 13.2* 11.4*  HGB 10.7*  10.3* 10.2* 11.8*  HCT 32.4* 32.4* 31.1* 35.8*  MCV 89.0 92.0 90.4 87.7  PLT 450* 433* 451* 545*    Cardiac Enzymes: No results for input(s): CKTOTAL, CKMB, CKMBINDEX, TROPONINI in the last 168 hours.  BNP (last 3 results) No results for input(s): BNP in the last 8760 hours.  ProBNP (last 3 results) No results for input(s): PROBNP in the last 8760 hours.  Radiological Exams: Dg Abd Portable 1v  Result Date: 01/08/2019 CLINICAL DATA:  Enteric feeding tube placement. EXAM: PORTABLE ABDOMEN - 1 VIEW COMPARISON:  01/03/2019 FINDINGS: New enteric tube extends below the diaphragm to curl within the proximal stomach in the left upper quadrant. Normal bowel gas pattern. IMPRESSION: 1. Enteric tube extends below the diaphragm into the proximal stomach. Metallic tip lies likely resides in the gastric fundus. Electronically Signed   By: Amie Portlandavid  Ormond M.D.   On: 01/08/2019 16:21    Assessment/Plan Active Problems:   Acute on chronic respiratory failure with hypoxia (HCC)   Severe sepsis (HCC)   Acute metabolic encephalopathy   Seizure disorder (HCC)   Polypharmacy   Bipolar 2 disorder (HCC)   1.  Acute on chronic respiratory failure with hypoxia successful weaning extubated now 2. Severe sepsis resolved we will continue with supportive care 3. Acute metabolic encephalopathy grossly unchanged 4. Seizure disorder no active seizure noted 5. Polypharmacy at baseline 6. Bipolar disorder at baseline we will monitor   I have personally seen and evaluated the patient, evaluated laboratory and imaging results, formulated the assessment and plan and placed orders. The Patient requires high complexity decision making for assessment and support.  Case was discussed on Rounds with the Respiratory Therapy Staff  [name], MD [facility] Pulmonary Critical Care Medicine Sleep Medicine

## 2019-01-10 DIAGNOSIS — G9341 Metabolic encephalopathy: Secondary | ICD-10-CM | POA: Diagnosis not present

## 2019-01-10 DIAGNOSIS — F3181 Bipolar II disorder: Secondary | ICD-10-CM | POA: Diagnosis not present

## 2019-01-10 DIAGNOSIS — Z79899 Other long term (current) drug therapy: Secondary | ICD-10-CM | POA: Diagnosis not present

## 2019-01-10 DIAGNOSIS — J9621 Acute and chronic respiratory failure with hypoxia: Secondary | ICD-10-CM | POA: Diagnosis not present

## 2019-01-10 NOTE — Progress Notes (Addendum)
Pulmonary Critical Care Medicine [place]   PULMONARY CRITICAL CARE SERVICE  PROGRESS NOTE  Date of Service: 01/10/2019  Lazar Tierce  PPI:[id number]  DOB: 1965-08-13   DOA: 12/24/2018  Referring Physician: Merton Border, MD  HPI: Martin Hays is a 53 y.o. male seen for follow up of Acute on Chronic Respiratory Failure.  Patient was extubated 2 days ago and continues to do well on room air.    Medications: Reviewed on Rounds  Physical Exam:  Vitals: Pulse 85 respiration 29 BP 146/83 O2 sat 93% temp 98.9  Ventilator Settings room air  . General: Comfortable at this time . Eyes: Grossly normal lids, irises & conjunctiva . ENT: grossly tongue is normal . Neck: no obvious mass . Cardiovascular: S1 S2 normal no gallop . Respiratory: No rales or rhonchi noted . Abdomen: soft . Skin: no rash seen on limited exam . Musculoskeletal: not rigid . Psychiatric:unable to assess . Neurologic: no seizure no involuntary movements         Lab Data:   Basic Metabolic Panel: Recent Labs  Lab 01/04/19 0542 01/06/19 0653 01/08/19 0621 01/09/19 0600  NA 138 141 140  --   K 3.6 3.5 3.3* 3.6  CL 99 104 102  --   CO2 25 25 27   --   GLUCOSE 204* 142* 166*  --   BUN 19 12 13   --   CREATININE 0.89 0.60* 0.54*  --   CALCIUM 9.0 8.6* 8.7*  --   MG 2.0 2.1 2.1  --   PHOS  --   --  2.8  --     ABG: No results for input(s): PHART, PCO2ART, PO2ART, HCO3, O2SAT in the last 168 hours.  Liver Function Tests: Recent Labs  Lab 01/04/19 0542  AST 22  ALT 19  ALKPHOS 57  BILITOT 0.6  PROT 7.1  ALBUMIN 2.1*   No results for input(s): LIPASE, AMYLASE in the last 168 hours. Recent Labs  Lab 01/06/19 0653  AMMONIA 26    CBC: Recent Labs  Lab 01/04/19 0542 01/05/19 0537 01/06/19 0653 01/08/19 0621  WBC 23.6* 18.5* 13.2* 11.4*  HGB 10.7* 10.3* 10.2* 11.8*  HCT 32.4* 32.4* 31.1* 35.8*  MCV 89.0 92.0 90.4 87.7  PLT 450* 433* 451* 545*    Cardiac  Enzymes: No results for input(s): CKTOTAL, CKMB, CKMBINDEX, TROPONINI in the last 168 hours.  BNP (last 3 results) No results for input(s): BNP in the last 8760 hours.  ProBNP (last 3 results) No results for input(s): PROBNP in the last 8760 hours.  Radiological Exams: No results found.  Assessment/Plan Active Problems:   Acute on chronic respiratory failure with hypoxia (HCC)   Severe sepsis (HCC)   Acute metabolic encephalopathy   Seizure disorder (HCC)   Polypharmacy   Bipolar 2 disorder ([place])   1. Acute on chronic respiratory failure with hypoxia patient continues on room air at this time was extubated and doing well.  Continue supportive measures at this time. 2. Severe sepsis resolved we will continue with supportive care 3. Acute metabolic encephalopathy grossly unchanged 4. Seizure disorder no active seizure noted 5. Polypharmacy at baseline 6. Bipolar disorder at baseline we will monitor   I have personally seen and evaluated the patient, evaluated laboratory and imaging results, formulated the assessment and plan and placed orders. The Patient requires high complexity decision making for assessment and support.  Case was discussed on Rounds with the Respiratory Therapy Staff  [name], MD [facility]  Pulmonary Critical Care Medicine Sleep Medicine

## 2019-01-11 ENCOUNTER — Other Ambulatory Visit (HOSPITAL_COMMUNITY): Payer: Self-pay

## 2019-01-11 DIAGNOSIS — G9341 Metabolic encephalopathy: Secondary | ICD-10-CM | POA: Diagnosis not present

## 2019-01-11 DIAGNOSIS — F3181 Bipolar II disorder: Secondary | ICD-10-CM | POA: Diagnosis not present

## 2019-01-11 DIAGNOSIS — J9621 Acute and chronic respiratory failure with hypoxia: Secondary | ICD-10-CM | POA: Diagnosis not present

## 2019-01-11 DIAGNOSIS — Z79899 Other long term (current) drug therapy: Secondary | ICD-10-CM | POA: Diagnosis not present

## 2019-01-11 LAB — BASIC METABOLIC PANEL
Anion gap: 14 (ref 5–15)
BUN: 18 mg/dL (ref 6–20)
CO2: 28 mmol/L (ref 22–32)
Calcium: 9.4 mg/dL (ref 8.9–10.3)
Chloride: 101 mmol/L (ref 98–111)
Creatinine, Ser: 0.65 mg/dL (ref 0.61–1.24)
GFR calc Af Amer: >60 mL/min (ref >60)
GFR calc non Af Amer: >60 mL/min (ref >60)
Glucose, Bld: 160 mg/dL — ABNORMAL HIGH (ref 70–99)
Potassium: 3.8 mmol/L (ref 3.5–5.1)
Sodium: 143 mmol/L (ref 135–145)

## 2019-01-11 LAB — VALPROIC ACID LEVEL: Valproic Acid Lvl: 54 ug/mL (ref 50.0–100.0)

## 2019-01-11 LAB — CBC
HCT: 44.6 % (ref 39.0–52.0)
Hemoglobin: 14 g/dL (ref 13.0–17.0)
MCH: 28.6 pg (ref 26.0–34.0)
MCHC: 31.4 g/dL (ref 30.0–36.0)
MCV: 91 fL (ref 80.0–100.0)
Platelets: 687 10*3/uL — ABNORMAL HIGH (ref 150–400)
RBC: 4.9 MIL/uL (ref 4.22–5.81)
RDW: 13.1 % (ref 11.5–15.5)
WBC: 18.1 10*3/uL — ABNORMAL HIGH (ref 4.0–10.5)
nRBC: 0 % (ref 0.0–0.2)

## 2019-01-11 LAB — C DIFFICILE QUICK SCREEN W PCR REFLEX
C Diff antigen: NEGATIVE
C Diff interpretation: NOT DETECTED
C Diff toxin: NEGATIVE

## 2019-01-11 LAB — PHOSPHORUS: Phosphorus: 3.7 mg/dL (ref 2.5–4.6)

## 2019-01-11 LAB — MAGNESIUM: Magnesium: 2.2 mg/dL (ref 1.7–2.4)

## 2019-01-11 NOTE — Progress Notes (Addendum)
[facility]   PULMONARY CRITICAL CARE SERVICE  PROGRESS NOTE  Date of Service: 01/11/2019  Ledora BottcherJohnny Slutsky  WJX:[id number]  DOB: [birth year]   DOA: 12/24/2018  Referring Physician: Carron CurieAli Hijazi, MD  HPI: Ledora BottcherJohnny Hays is a 53 y.o. male seen for follow up of Acute on Chronic Respiratory Failure.  Patient remains on room air at this time satting well with no distress.  Medications: Reviewed on Rounds  Physical Exam:  Vitals: Pulse 87 respirations 25 BP 128/82 O2 sat 97% temp 97.8  Ventilator Settings room air  . General: Comfortable at this time . Eyes: Grossly normal lids, irises & conjunctiva . ENT: grossly tongue is normal . Neck: no obvious mass . Cardiovascular: S1 S2 normal no gallop . Respiratory: No rales or rhonchi noted . Abdomen: soft . Skin: no rash seen on limited exam . Musculoskeletal: not rigid . Psychiatric:unable to assess . Neurologic: no seizure no involuntary movements         Lab Data:   Basic Metabolic Panel: Recent Labs  Lab 01/06/19 0653 01/08/19 0621 01/09/19 0600 01/11/19 0446  NA 141 140  --  143  K 3.5 3.3* 3.6 3.8  CL 104 102  --  101  CO2 25 27  --  28  GLUCOSE 142* 166*  --  160*  BUN 12 13  --  18  CREATININE 0.60* 0.54*  --  0.65  CALCIUM 8.6* 8.7*  --  9.4  MG 2.1 2.1  --  2.2  PHOS  --  2.8  --  3.7    ABG: No results for input(s): PHART, PCO2ART, PO2ART, HCO3, O2SAT in the last 168 hours.  Liver Function Tests: No results for input(s): AST, ALT, ALKPHOS, BILITOT, PROT, ALBUMIN in the last 168 hours. No results for input(s): LIPASE, AMYLASE in the last 168 hours. Recent Labs  Lab 01/06/19 0653  AMMONIA 26    CBC: Recent Labs  Lab 01/05/19 0537 01/06/19 0653 01/08/19 0621 01/11/19 0446  WBC 18.5* 13.2* 11.4* 18.1*  HGB 10.3* 10.2* 11.8* 14.0  HCT 32.4* 31.1* 35.8* 44.6  MCV 92.0 90.4 87.7 91.0  PLT 433* 451* 545* 687*    Cardiac Enzymes: No results  for input(s): CKTOTAL, CKMB, CKMBINDEX, TROPONINI in the last 168 hours.  BNP (last 3 results) No results for input(s): BNP in the last 8760 hours.  ProBNP (last 3 results) No results for input(s): PROBNP in the last 8760 hours.  Radiological Exams: Dg Abd 1 View  Result Date: 01/11/2019 CLINICAL DATA:  Check feeding catheter placement EXAM: ABDOMEN - 1 VIEW COMPARISON:  None. FINDINGS: A weighted feeding catheter is again identified coiled within the stomach. The overall appearance is similar to that seen on the prior exam. Postsurgical changes in the lumbar spine and left hip are noted. No obstructive changes are seen. IMPRESSION: Weighted feeding catheter coiled within the stomach. Electronically Signed   By: Alcide CleverMark  Lukens M.D.   On: 01/11/2019 17:21    Assessment/Plan Active Problems:   Acute on chronic respiratory failure with hypoxia (HCC)   Severe sepsis (HCC)   Acute metabolic encephalopathy   Seizure disorder (HCC)   Polypharmacy   Bipolar 2 disorder (HCC)   1. Acute on chronic respiratory failure with hypoxia patient continues on room air at this time was extubated and doing well.  Continue supportive measures at this time. 2. Severe sepsis resolved we will continue with supportive care 3. Acute metabolic encephalopathy grossly unchanged 4. Seizure  disorder no active seizure noted 5. Polypharmacy at baseline 6. Bipolar disorder at baseline we will monitor   I have personally seen and evaluated the patient, evaluated laboratory and imaging results, formulated the assessment and plan and placed orders. The Patient requires high complexity decision making for assessment and support.  Case was discussed on Rounds with the Respiratory Therapy Staff  [name], MD [facility] Pulmonary Critical Care Medicine Sleep Medicine

## 2019-01-12 ENCOUNTER — Other Ambulatory Visit (HOSPITAL_COMMUNITY): Payer: Self-pay

## 2019-01-12 DIAGNOSIS — F3181 Bipolar II disorder: Secondary | ICD-10-CM | POA: Diagnosis not present

## 2019-01-12 DIAGNOSIS — Z79899 Other long term (current) drug therapy: Secondary | ICD-10-CM | POA: Diagnosis not present

## 2019-01-12 DIAGNOSIS — J9621 Acute and chronic respiratory failure with hypoxia: Secondary | ICD-10-CM | POA: Diagnosis not present

## 2019-01-12 DIAGNOSIS — G9341 Metabolic encephalopathy: Secondary | ICD-10-CM | POA: Diagnosis not present

## 2019-01-12 LAB — URINALYSIS, ROUTINE W REFLEX MICROSCOPIC
Bilirubin Urine: NEGATIVE
Glucose, UA: NEGATIVE mg/dL
Hgb urine dipstick: NEGATIVE
Ketones, ur: NEGATIVE mg/dL
Nitrite: NEGATIVE
Protein, ur: 30 mg/dL — AB
Specific Gravity, Urine: 1.026 (ref 1.005–1.030)
pH: 5 (ref 5.0–8.0)

## 2019-01-12 LAB — BASIC METABOLIC PANEL
Anion gap: 11 (ref 5–15)
BUN: 18 mg/dL (ref 6–20)
CO2: 26 mmol/L (ref 22–32)
Calcium: 9.4 mg/dL (ref 8.9–10.3)
Chloride: 104 mmol/L (ref 98–111)
Creatinine, Ser: 0.82 mg/dL (ref 0.61–1.24)
Glucose, Bld: 197 mg/dL — ABNORMAL HIGH (ref 70–99)
Potassium: 4.3 mmol/L (ref 3.5–5.1)
Sodium: 141 mmol/L (ref 135–145)

## 2019-01-12 LAB — CBC
HCT: 42.1 % (ref 39.0–52.0)
Hemoglobin: 13.3 g/dL (ref 13.0–17.0)
MCH: 28.7 pg (ref 26.0–34.0)
MCHC: 31.6 g/dL (ref 30.0–36.0)
MCV: 90.9 fL (ref 80.0–100.0)
Platelets: 673 K/uL — ABNORMAL HIGH (ref 150–400)
RBC: 4.63 MIL/uL (ref 4.22–5.81)
RDW: 13.2 % (ref 11.5–15.5)
WBC: 20.5 K/uL — ABNORMAL HIGH (ref 4.0–10.5)
nRBC: 0 % (ref 0.0–0.2)

## 2019-01-12 LAB — AMMONIA: Ammonia: 61 umol/L — ABNORMAL HIGH (ref 9–35)

## 2019-01-12 NOTE — Progress Notes (Addendum)
Pulmonary Critical Care Medicine [place]   PULMONARY CRITICAL CARE SERVICE  PROGRESS NOTE  Date of Service: 01/12/2019  Martin Hays  [id number]  DOB: [birth year]   DOA: 12/24/2018  Referring Physician: Merton Border, MD  HPI: Martin Hays is a 53 y.o. male seen for follow up of Acute on Chronic Respiratory Failure.  Patient remains on room air at this time satting well with no fever or distress noted.  Medications: Reviewed on Rounds  Physical Exam:  Vitals: Pulse 82 respirations 20 P10 7/69 O2 sat by 7% temp 97.0  Ventilator Settings room air  . General: Comfortable at this time . Eyes: Grossly normal lids, irises & conjunctiva . ENT: grossly tongue is normal . Neck: no obvious mass . Cardiovascular: S1 S2 normal no gallop . Respiratory: No rales or rhonchi noted . Abdomen: soft . Skin: no rash seen on limited exam . Musculoskeletal: not rigid . Psychiatric:unable to assess . Neurologic: no seizure no involuntary movements         Lab Data:   Basic Metabolic Panel: Recent Labs  Lab 01/06/19 0653 01/08/19 0621 01/09/19 0600 01/11/19 0446 01/12/19 0438  NA 141 140  --  143 141  K 3.5 3.3* 3.6 3.8 4.3  CL 104 102  --  101 104  CO2 25 27  --  28 26  GLUCOSE 142* 166*  --  160* 197*  BUN 12 13  --  18 18  CREATININE 0.60* 0.54*  --  0.65 0.82  CALCIUM 8.6* 8.7*  --  9.4 9.4  MG 2.1 2.1  --  2.2  --   PHOS  --  2.8  --  3.7  --     ABG: No results for input(s): PHART, PCO2ART, PO2ART, HCO3, O2SAT in the last 168 hours.  Liver Function Tests: No results for input(s): AST, ALT, ALKPHOS, BILITOT, PROT, ALBUMIN in the last 168 hours. No results for input(s): LIPASE, AMYLASE in the last 168 hours. Recent Labs  Lab 01/06/19 0653 01/12/19 0438  AMMONIA 26 61*    CBC: Recent Labs  Lab 01/06/19 0653 01/08/19 0621 01/11/19 0446 01/12/19 0438  WBC 13.2* 11.4* 18.1* 20.5*  HGB 10.2* 11.8* 14.0 13.3  HCT 31.1* 35.8* 44.6  42.1  MCV 90.4 87.7 91.0 90.9  PLT 451* 545* 687* 673*    Cardiac Enzymes: No results for input(s): CKTOTAL, CKMB, CKMBINDEX, TROPONINI in the last 168 hours.  BNP (last 3 results) No results for input(s): BNP in the last 8760 hours.  ProBNP (last 3 results) No results for input(s): PROBNP in the last 8760 hours.  Radiological Exams: Dg Abd 1 View  Result Date: 01/11/2019 CLINICAL DATA:  NG tube placement EXAM: ABDOMEN - 1 VIEW COMPARISON:  01/11/2019 at 1652 hours FINDINGS: Weighted feeding tube looped in the gastric cardia. When compared to the prior, a small kink is present distally, but it is otherwise unchanged. Lateral fixation hardware at L4-5 with degenerative changes. IMPRESSION: Weighted feeding tube looped in the gastric cardia, as described above. Electronically Signed   By: Julian Hy M.D.   On: 01/11/2019 19:29   Dg Abd 1 View  Result Date: 01/11/2019 CLINICAL DATA:  Check feeding catheter placement EXAM: ABDOMEN - 1 VIEW COMPARISON:  None. FINDINGS: A weighted feeding catheter is again identified coiled within the stomach. The overall appearance is similar to that seen on the prior exam. Postsurgical changes in the lumbar spine and left hip are noted. No obstructive changes are seen. IMPRESSION:  Weighted feeding catheter coiled within the stomach. Electronically Signed   By: Alcide CleverMark  Lukens M.D.   On: 01/11/2019 17:21   Dg Abd Portable 1v  Result Date: 01/12/2019 CLINICAL DATA:  NG tube placement EXAM: PORTABLE ABDOMEN - 1 VIEW COMPARISON:  01/11/2019 FINDINGS: Esophageal tube tip overlies the junction of 1/2 portion of duodenum. Gas pattern is nonobstructed. Hardware within the lumbar spine. IMPRESSION: Esophageal tube tip overlies the proximal duodenum. Electronically Signed   By: Jasmine PangKim  Fujinaga M.D.   On: 01/12/2019 01:08    Assessment/Plan Active Problems:   Acute on chronic respiratory failure with hypoxia (HCC)   Severe sepsis (HCC)   Acute metabolic  encephalopathy   Seizure disorder (HCC)   Polypharmacy   Bipolar 2 disorder (HCC)   1. Acute on chronic respiratory failure with hypoxiapatient continues on room air at this time was extubated and doing well. Continue supportive measures at this time. 2. Severe sepsis resolved we will continue with supportive care 3. Acute metabolic encephalopathy grossly unchanged 4. Seizure disorder no active seizure noted 5. Polypharmacy at baseline 6. Bipolar disorder at baseline we will monitor   I have personally seen and evaluated the patient, evaluated laboratory and imaging results, formulated the assessment and plan and placed orders. The Patient requires high complexity decision making for assessment and support.  Case was discussed on Rounds with the Respiratory Therapy Staff  Yevonne PaxSaadat A Khan, MD [facility] Pulmonary Critical Care Medicine Sleep Medicine

## 2019-01-13 LAB — URINE CULTURE: Culture: NO GROWTH

## 2019-01-13 LAB — LEVETIRACETAM LEVEL: Levetiracetam Lvl: 22.3 ug/mL (ref 10.0–40.0)

## 2019-01-14 ENCOUNTER — Other Ambulatory Visit (HOSPITAL_COMMUNITY): Payer: Self-pay

## 2019-01-14 LAB — CULTURE, RESPIRATORY W GRAM STAIN: Culture: NORMAL

## 2019-01-14 LAB — CBC
HCT: 44.9 % (ref 39.0–52.0)
Hemoglobin: 13.9 g/dL (ref 13.0–17.0)
MCH: 28.7 pg (ref 26.0–34.0)
MCHC: 31 g/dL (ref 30.0–36.0)
MCV: 92.6 fL (ref 80.0–100.0)
Platelets: 618 10*3/uL — ABNORMAL HIGH (ref 150–400)
RBC: 4.85 MIL/uL (ref 4.22–5.81)
RDW: 13.6 % (ref 11.5–15.5)
WBC: 20.3 10*3/uL — ABNORMAL HIGH (ref 4.0–10.5)
nRBC: 0 % (ref 0.0–0.2)

## 2019-01-18 LAB — CBC
HCT: 41.5 % (ref 39.0–52.0)
Hemoglobin: 13.3 g/dL (ref 13.0–17.0)
MCH: 29.1 pg (ref 26.0–34.0)
MCHC: 32 g/dL (ref 30.0–36.0)
MCV: 90.8 fL (ref 80.0–100.0)
Platelets: 454 10*3/uL — ABNORMAL HIGH (ref 150–400)
RBC: 4.57 MIL/uL (ref 4.22–5.81)
RDW: 14.1 % (ref 11.5–15.5)
WBC: 23.4 10*3/uL — ABNORMAL HIGH (ref 4.0–10.5)
nRBC: 0 % (ref 0.0–0.2)

## 2019-01-18 LAB — VALPROIC ACID LEVEL: Valproic Acid Lvl: 43 ug/mL — ABNORMAL LOW (ref 50.0–100.0)

## 2019-01-20 LAB — BASIC METABOLIC PANEL
Anion gap: 9 (ref 5–15)
BUN: 20 mg/dL (ref 6–20)
CO2: 26 mmol/L (ref 22–32)
Calcium: 9.2 mg/dL (ref 8.9–10.3)
Chloride: 100 mmol/L (ref 98–111)
Creatinine, Ser: 0.96 mg/dL (ref 0.61–1.24)
GFR calc Af Amer: >60 mL/min (ref >60)
GFR calc non Af Amer: >60 mL/min (ref >60)
Glucose, Bld: 259 mg/dL — ABNORMAL HIGH (ref 70–99)
Potassium: 4.9 mmol/L (ref 3.5–5.1)
Sodium: 135 mmol/L (ref 135–145)

## 2019-01-20 LAB — CBC
HCT: 38.9 % — ABNORMAL LOW (ref 39.0–52.0)
Hemoglobin: 12.8 g/dL — ABNORMAL LOW (ref 13.0–17.0)
MCH: 29.6 pg (ref 26.0–34.0)
MCHC: 32.9 g/dL (ref 30.0–36.0)
MCV: 90 fL (ref 80.0–100.0)
Platelets: 390 10*3/uL (ref 150–400)
RBC: 4.32 MIL/uL (ref 4.22–5.81)
RDW: 14.4 % (ref 11.5–15.5)
WBC: 16 10*3/uL — ABNORMAL HIGH (ref 4.0–10.5)
nRBC: 0 % (ref 0.0–0.2)

## 2019-01-20 LAB — LEVETIRACETAM LEVEL: Levetiracetam Lvl: 19.5 ug/mL (ref 10.0–40.0)

## 2019-01-20 LAB — MAGNESIUM: Magnesium: 2.1 mg/dL (ref 1.7–2.4)

## 2019-01-20 LAB — PHOSPHORUS: Phosphorus: 4.4 mg/dL (ref 2.5–4.6)

## 2021-06-19 IMAGING — DX ABDOMEN - 1 VIEW
1 series · 1 of 1 positions shown · non-contrast
Comparison: Abdominal x-ray dated December 29, 2018.

CLINICAL DATA: Nausea and vomiting.

EXAM:
ABDOMEN - 1 VIEW

[abdomen kub]
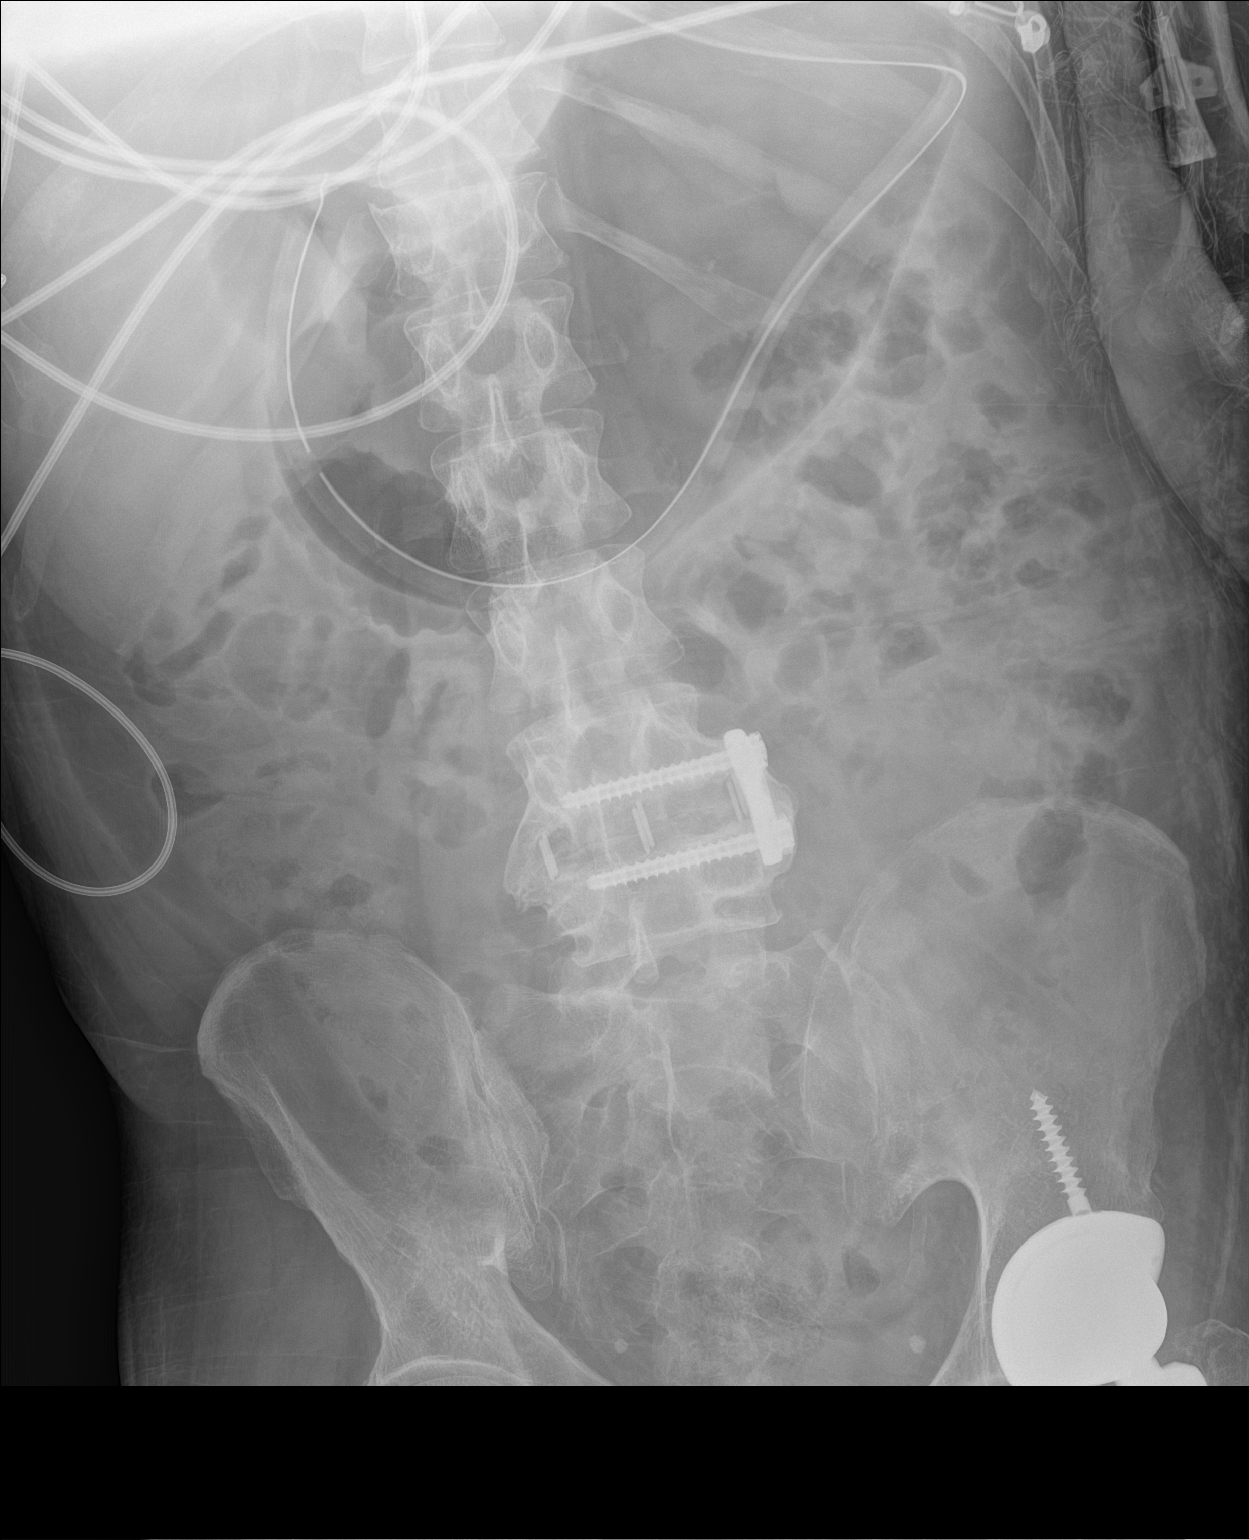

[1 of 1 positions shown; findings below may reference images not displayed]

FINDINGS: Enteric tube tip in the distal stomach near the pylorus. Prominent
gaseous distention of the stomach. The bowel gas pattern is normal.
No radio-opaque calculi or other significant radiographic
abnormality are seen. Unchanged phleboliths in the pelvis. No acute
osseous abnormality. Prior L4-L5 fusion and left hip arthroplasty.
IMPRESSION: 1. Prominent gaseous distention of the stomach. Correlate with
enteric tube function.
2. No bowel obstruction.

## 2021-06-20 IMAGING — DX PORTABLE CHEST - 1 VIEW
1 series · 1 of 1 positions shown · non-contrast
Comparison: Radiograph January 02, 2019.

CLINICAL DATA: Respiratory distress.

EXAM:
PORTABLE CHEST 1 VIEW

[chest ap]
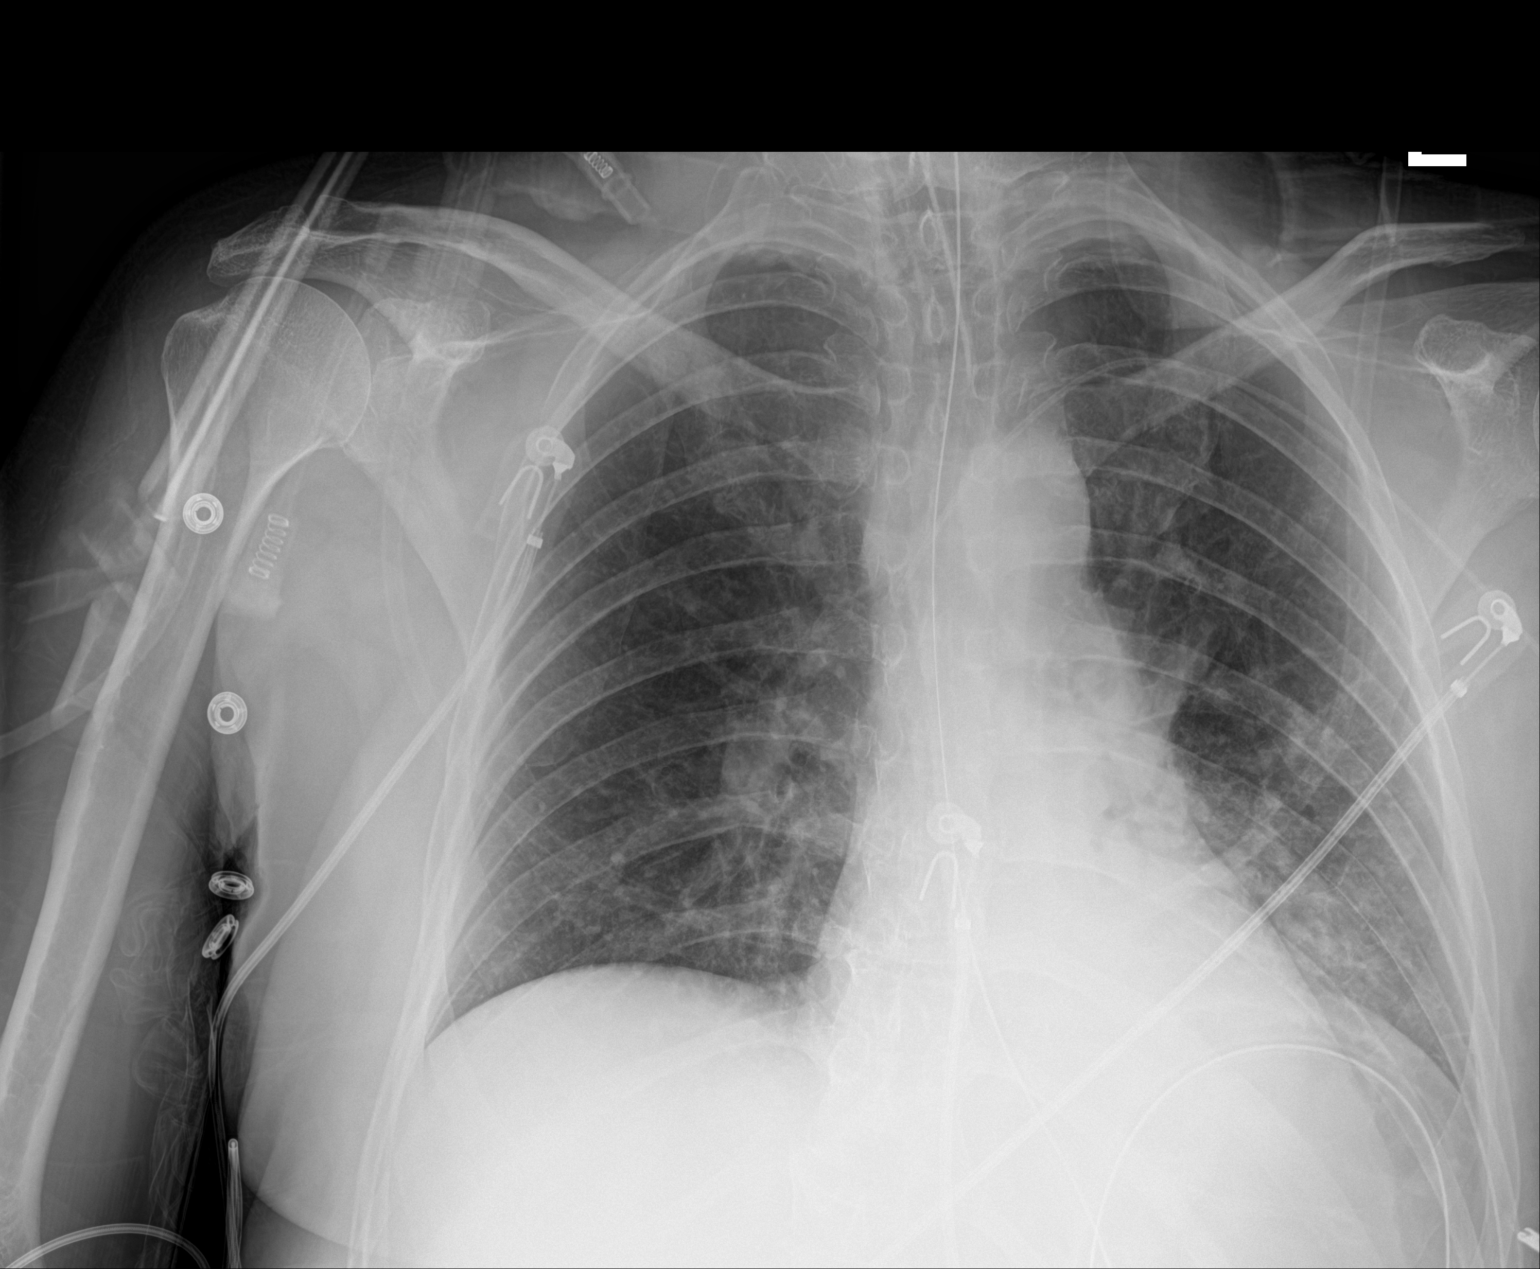

[1 of 1 positions shown; findings below may reference images not displayed]

FINDINGS: The heart size and mediastinal contours are within normal limits.
Endotracheal nasogastric tubes are unchanged in position. No
pneumothorax is noted. Left-sided PICC line is in good position.
Right lung is clear. Left basilar atelectasis or pneumonia is noted.
The visualized skeletal structures are unremarkable.
IMPRESSION: Left basilar atelectasis or pneumonia is noted. Stable support
apparatus.

## 2021-06-27 IMAGING — DX ABDOMEN - 1 VIEW
1 series · 1 of 1 positions shown · non-contrast
Comparison: 01/11/2019 at 9146 hours

CLINICAL DATA: NG tube placement

EXAM:
ABDOMEN - 1 VIEW

[abdomen]
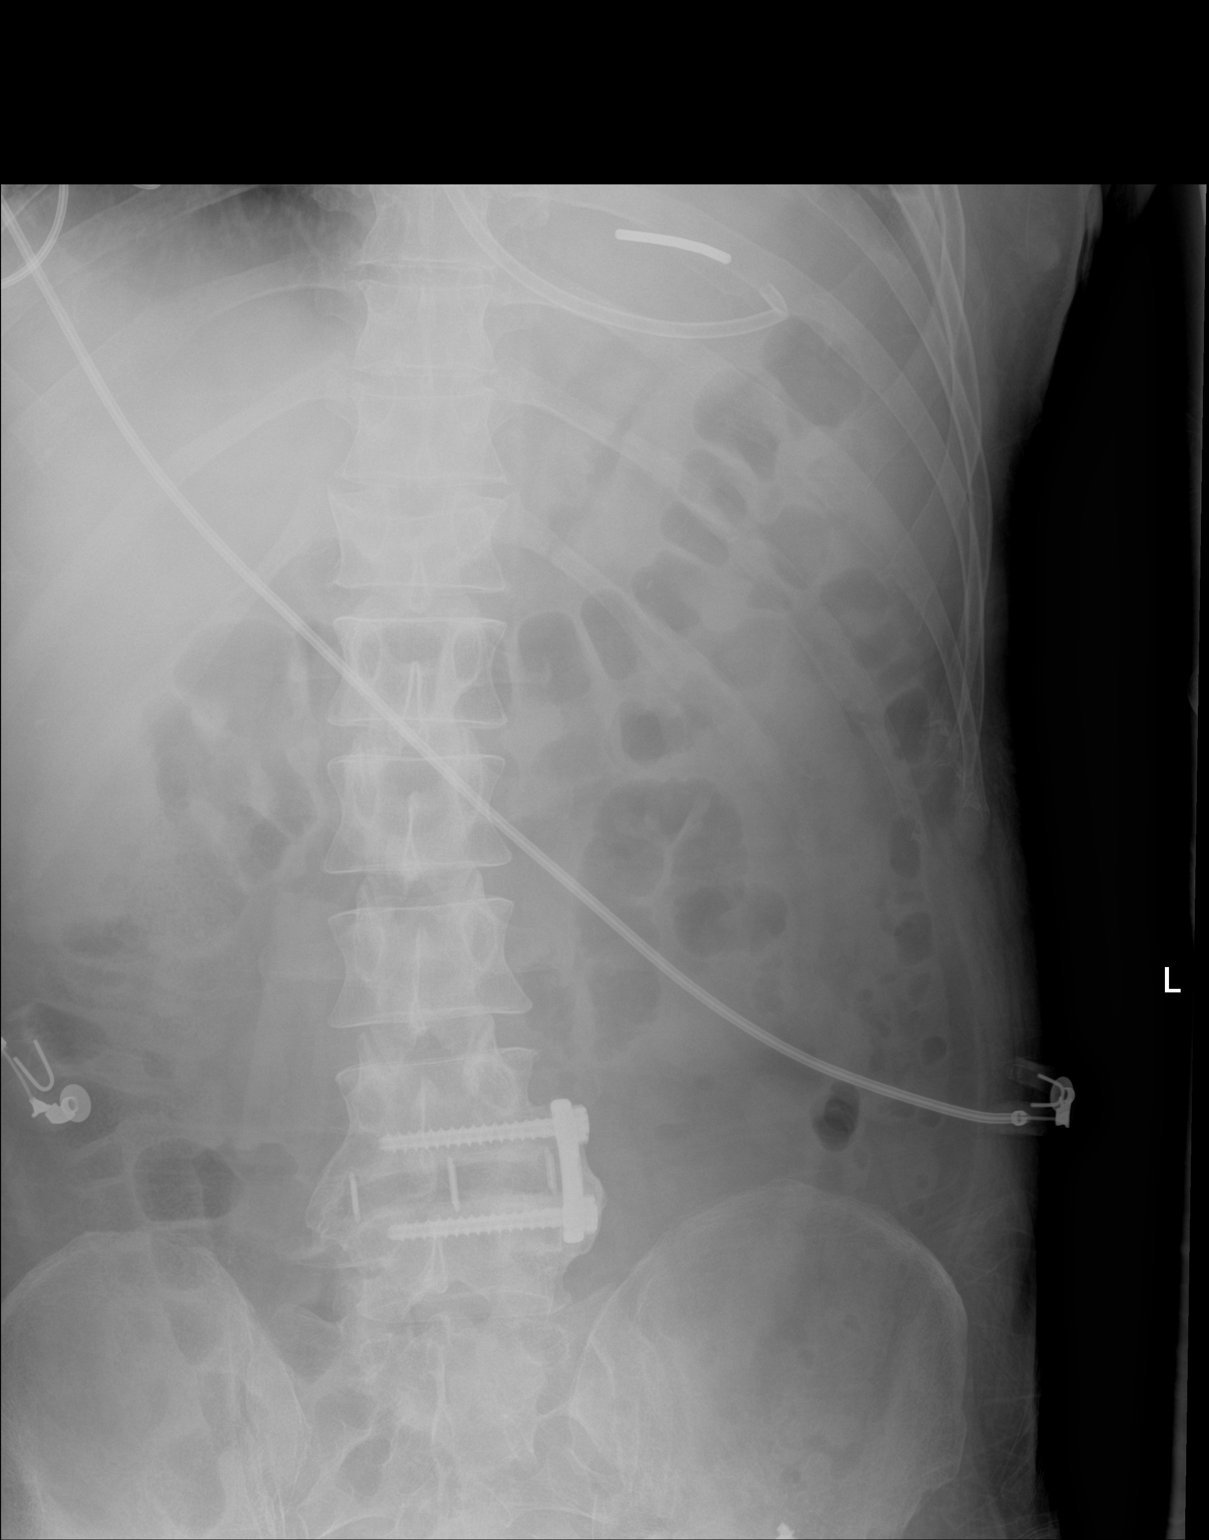

[1 of 1 positions shown; findings below may reference images not displayed]

FINDINGS: Weighted feeding tube looped in the gastric cardia. When compared to
the prior, a small kink is present distally, but it is otherwise
unchanged.

Lateral fixation hardware at L4-5 with degenerative changes.
IMPRESSION: Weighted feeding tube looped in the gastric cardia, as described
above.
